# Patient Record
Sex: Male | Born: 1960 | Race: Black or African American | Hispanic: No | Marital: Married | Smoking: Never smoker
Health system: Southern US, Community
[De-identification: ages and names within clinical notes are randomized; demographics above are authoritative.]

## PROBLEM LIST (undated history)

## (undated) DIAGNOSIS — E7800 Pure hypercholesterolemia, unspecified: Secondary | ICD-10-CM

## (undated) DIAGNOSIS — K219 Gastro-esophageal reflux disease without esophagitis: Secondary | ICD-10-CM

## (undated) DIAGNOSIS — D649 Anemia, unspecified: Secondary | ICD-10-CM

## (undated) DIAGNOSIS — I10 Essential (primary) hypertension: Secondary | ICD-10-CM

## (undated) DIAGNOSIS — S76119A Strain of unspecified quadriceps muscle, fascia and tendon, initial encounter: Secondary | ICD-10-CM

## (undated) DIAGNOSIS — R7303 Prediabetes: Secondary | ICD-10-CM

## (undated) HISTORY — DX: Gastro-esophageal reflux disease without esophagitis: K21.9

## (undated) HISTORY — DX: Pure hypercholesterolemia, unspecified: E78.00

## (undated) HISTORY — DX: Strain of unspecified quadriceps muscle, fascia and tendon, initial encounter: S76.119A

## (undated) HISTORY — DX: Anemia, unspecified: D64.9

## (undated) HISTORY — DX: Prediabetes: R73.03

## (undated) HISTORY — DX: Essential (primary) hypertension: I10

---

## 2013-08-08 DIAGNOSIS — I10 Essential (primary) hypertension: Secondary | ICD-10-CM

## 2013-08-08 DIAGNOSIS — E7800 Pure hypercholesterolemia, unspecified: Secondary | ICD-10-CM

## 2013-08-08 HISTORY — DX: Essential (primary) hypertension: I10

## 2013-08-08 HISTORY — DX: Pure hypercholesterolemia, unspecified: E78.00

## 2013-12-19 ENCOUNTER — Ambulatory Visit: Payer: Self-pay | Admitting: Gastroenterology

## 2013-12-23 LAB — PATHOLOGY REPORT

## 2014-04-03 HISTORY — PX: KNEE SURGERY: SHX244

## 2014-04-22 DIAGNOSIS — R7303 Prediabetes: Secondary | ICD-10-CM

## 2014-04-22 HISTORY — DX: Prediabetes: R73.03

## 2015-04-26 DIAGNOSIS — Z8249 Family history of ischemic heart disease and other diseases of the circulatory system: Secondary | ICD-10-CM | POA: Insufficient documentation

## 2016-11-20 DIAGNOSIS — F5221 Male erectile disorder: Secondary | ICD-10-CM | POA: Insufficient documentation

## 2016-11-20 DIAGNOSIS — K219 Gastro-esophageal reflux disease without esophagitis: Secondary | ICD-10-CM | POA: Insufficient documentation

## 2016-11-20 HISTORY — DX: Gastro-esophageal reflux disease without esophagitis: K21.9

## 2017-05-29 DIAGNOSIS — D649 Anemia, unspecified: Secondary | ICD-10-CM

## 2017-05-29 HISTORY — DX: Anemia, unspecified: D64.9

## 2017-06-14 NOTE — Progress Notes (Signed)
06/15/2017 3:01 PM   Timothy Price [id number] [id number]  Referring provider: Cheryll Dessert, FNP [address] [name], [place] [address]  Chief Complaint  Patient presents with  . Elevated PSA    New Patient  . Erectile Dysfunction  . Urinary Frequency    HPI: Patient is a 57 year old diabetic male who was referred by Cheryll Dessert, FNP for erectile dysfunction and elevated PSA.  Erectile dysfunction SHIM 12.  He has been having difficulty with erections for awhile.   His major complaint is lack of firmness.   His libido is preserved.  His risk factors for ED are age, BPH, DM, HTN and HLD.  He denies any painful erections or curvatures with his erections.   He is still having spontaneous erections.  He has tried supplements in the past.   SHIM    Row Name 06/15/17 0858         SHIM: Over the last 6 months:   How do you rate your confidence that you could get and keep an erection?  Low     When you had erections with sexual stimulation, how often were your erections hard enough for penetration (entering your partner)?  Sometimes (about half the time)     During sexual intercourse, how often were you able to maintain your erection after you had penetrated (entered) your partner?  A Few Times (much less than half the time)     During sexual intercourse, how difficult was it to maintain your erection to completion of intercourse?  Difficult     When you attempted sexual intercourse, how often was it satisfactory for you?  A Few Times (much less than half the time)       SHIM Total Score   SHIM  12        Score: 1-7 Severe ED 8-11 Moderate ED 12-16 Mild-Moderate ED 17-21 Mild ED 22-25 No ED  Elevated PSA PSA trend 3.13 in 11/2014 4.27 in 11/2016 3.65 in 05/2017   BPH WITH LUTS  (prostate and/or bladder) IPSS score: 26/4   PVR: 53 mL     Major complaint(s):frequency x 6-7, strong urgency, nocturia x 2 and intermittency  x for awhile.  Denies any dysuria, hematuria or suprapubic pain.   Denies any recent fevers, chills, nausea or vomiting.  He does not have a family history of PCa.  IPSS    Row Name 06/15/17 0800         International Prostate Symptom Score   How often have you had the sensation of not emptying your bladder?  Almost always     How often have you had to urinate less than every two hours?  Almost always     How often have you found you stopped and started again several times when you urinated?  Almost always     How often have you found it difficult to postpone urination?  Almost always     How often have you had a weak urinary stream?  Less than half the time     How often have you had to strain to start urination?  Less than half the time     How many times did you typically get up at night to urinate?  2 Times     Total IPSS Score  26       Quality of Life due to urinary symptoms   If you were to spend the rest of your life with your  urinary condition just the way it is now how would you feel about that?  Mostly Disatisfied        Score:  1-7 Mild 8-19 Moderate 20-35 Severe  PMH: Past Medical History:  Diagnosis Date  . Anemia, unspecified 05/29/2017   Overview:  Monitoring  . Gastroesophageal reflux disease without esophagitis 11/20/2016   Overview:  Controlled OTC prn treatment only.   Marland Kitchen HTN (hypertension) 08/08/2013  . Hypercholesterolemia 08/08/2013  . Prediabetes 04/22/2014  . Rupture of quadriceps tendon 06/15/2017    Surgical History: Knee surgery 2016  Home Medications:  Allergies as of 06/15/2017   No Known Allergies     Medication List        Accurate as of 06/15/17 11:59 PM. Always use your most recent med list.          aspirin EC 81 MG tablet Take by mouth.   atorvastatin 20 MG tablet Commonly known as:  LIPITOR Take by mouth.   finasteride 5 MG tablet Commonly known as:  PROSCAR Take 1 tablet (5 mg total) by mouth daily.   lisinopril-hydrochlorothiazide  20-25 MG tablet Commonly known as:  PRINZIDE,ZESTORETIC Take by mouth.   mirabegron ER 25 MG Tb24 tablet Commonly known as:  MYRBETRIQ Take 1 tablet (25 mg total) by mouth daily.   niacin 500 MG tablet Take by mouth.   sildenafil 100 MG tablet Commonly known as:  VIAGRA Take 1 tablet (100 mg total) by mouth daily as needed for erectile dysfunction.       Allergies: No Known Allergies  Family History: Family History  Problem Relation Age of Onset  . Prostate cancer Neg Hx   . Kidney Stones Neg Hx     Social History:  reports that he has never smoked. He has never used smokeless tobacco. He reports that he drinks alcohol. He reports that he does not use drugs.  ROS: UROLOGY Frequent Urination?: Yes Hard to postpone urination?: Yes Burning/pain with urination?: No Get up at night to urinate?: Yes Leakage of urine?: No Urine stream starts and stops?: Yes Trouble starting stream?: No Do you have to strain to urinate?: No Blood in urine?: No Urinary tract infection?: No Sexually transmitted disease?: No Injury to kidneys or bladder?: No Painful intercourse?: No Weak stream?: No Erection problems?: Yes Penile pain?: No  Gastrointestinal Nausea?: No Vomiting?: No Indigestion/heartburn?: No Diarrhea?: No Constipation?: Yes  Constitutional Fever: No Night sweats?: Yes Weight loss?: No Fatigue?: No  Skin Skin rash/lesions?: No Itching?: No  Eyes Blurred vision?: No Double vision?: No  Ears/Nose/Throat Sore throat?: No Sinus problems?: No  Hematologic/Lymphatic Swollen glands?: No Easy bruising?: No  Cardiovascular Leg swelling?: No Chest pain?: No  Respiratory Cough?: No Shortness of breath?: No  Endocrine Excessive thirst?: No  Musculoskeletal Back pain?: No Joint pain?: Yes  Neurological Headaches?: No Dizziness?: No  Psychologic Depression?: No Anxiety?: No  Physical Exam: BP 139/71   Pulse 72   Ht 6' (1.829 m)   Wt 255  lb (115.7 kg)   BMI 34.58 kg/m   Constitutional: Well nourished. Alert and oriented, No acute distress. HEENT: [place] AT, moist mucus membranes. Trachea midline, no masses. Cardiovascular: No clubbing, cyanosis, or edema. Respiratory: Normal respiratory effort, no increased work of breathing. GI: Abdomen is soft, non tender, non distended, no abdominal masses. Liver and spleen not palpable.  No hernias appreciated.  Stool sample for occult testing is not indicated.   GU: No CVA tenderness.  No bladder fullness or masses.  Patient with circumcised phallus.   Urethral meatus is patent.  No penile discharge. No penile lesions or rashes. Scrotum without lesions, cysts, rashes and/or edema.  Testicles are located scrotally bilaterally. No masses are appreciated in the testicles. Left and right epididymis are normal. Rectal: Patient with  normal sphincter tone. Anus and perineum without scarring or rashes. No rectal masses are appreciated. Prostate is approximately 55 grams, no nodules are appreciated. Seminal vesicles are normal. Skin: No rashes, bruises or suspicious lesions. Lymph: No cervical or inguinal adenopathy. Neurologic: Grossly intact, no focal deficits, moving all 4 extremities. Psychiatric: Normal mood and affect.  Laboratory Data: No results found for: WBC, HGB, HCT, MCV, PLT  No results found for: CREATININE  No results found for: PSA  No results found for: TESTOSTERONE  No results found for: HGBA1C  No results found for: TSH  No results found for: CHOL, HDL, CHOLHDL, VLDL, LDLCALC  No results found for: AST No results found for: ALT No components found for: ALKALINEPHOPHATASE No components found for: BILIRUBINTOTAL  No results found for: ESTRADIOL  Urinalysis No results found for: COLORURINE, APPEARANCEUR, LABSPEC, PHURINE, GLUCOSEU, HGBUR, BILIRUBINUR, KETONESUR, PROTEINUR, UROBILINOGEN, NITRITE, LEUKOCYTESUR  I have reviewed the labs.   Pertinent  Imaging: Results for Timothy LefortDAVIS, Alyan L (MRN [id number]) as of 07/09/2017 15:01  Ref. Range 06/15/2017 08:55  Scan Result Unknown 53ml    Assessment & Plan:    1. History of elevated PSA PSA trend is not concerning at this time Continue with annual monitoring  2. Erectile dysfunction  - SHIM score is 12  - I explained to the patient that in order to achieve an erection it takes good functioning of the nervous system (parasympathetic and rs, sympathetic, sensory and motor), good blood flow into the erectile tissue of the penis and a desire to have sex  - I explained that conditions like diabetes, hypertension, coronary artery disease, peripheral vascular disease, smoking, alcohol consumption, age, sleep apnea and BPH can diminish the ability to have an erection  - A recent study published in Sex Med 2018 Apr 13 revealed moderate to vigorous aerobic exercise for 40 minutes 4 times per week can decrease erectile problems caused by physical inactivity, obesity, hypertension, metabolic syndrome and/or cardiovascular diseases  - We discussed trying a PDE5 inhibitor - patient is agreeable to this  -Prescription sent in for Viagra 100 mg advised to take 2 hours prior to intercourse and empty stomach not to take it with any nitrates  - RTC in 3 weeks for repeat SHIM score and exam   3. BPH with LUTS  - IPSS score is 26/4  - Continue conservative management, avoiding bladder irritants and timed voiding's  - most bothersome symptoms is/are frequency and strong urgency  - Initiate 5 alpha reductase inhibitor (finasteride 5 mg daily), discussed side effects   - RTC in 3 months for IPSS and exam   4. Frequency Offered medical therapy with anticholinergic therapy or beta-3 adrenergic receptor agonist and the potential side effects of each therapy- would like to try the beta-3 adrenergic receptor agonist (Myrbetriq).  Given Myrbetriq 25 mg samples, #28.  I have reviewed with the patient of the side effects of  Myrbetriq, such as: elevation in BP, urinary retention and/or HA.   RTC in 3 weeks for PVR and symptom recheck    Return in about 3 weeks (around 07/06/2017) for IPSS and PVR.  These notes generated with voice recognition software. I apologize for typographical errors.  Tywanda Rice,  PA-C  [place] [address] [employer] [place], [place] [address] [phone]

## 2017-06-15 ENCOUNTER — Encounter: Payer: Self-pay | Admitting: Urology

## 2017-06-15 ENCOUNTER — Ambulatory Visit (INDEPENDENT_AMBULATORY_CARE_PROVIDER_SITE_OTHER): Payer: 59 | Admitting: Urology

## 2017-06-15 VITALS — BP 139/71 | HR 72 | Ht 72.0 in | Wt 255.0 lb

## 2017-06-15 DIAGNOSIS — Z87898 Personal history of other specified conditions: Secondary | ICD-10-CM

## 2017-06-15 DIAGNOSIS — N138 Other obstructive and reflux uropathy: Secondary | ICD-10-CM

## 2017-06-15 DIAGNOSIS — R350 Frequency of micturition: Secondary | ICD-10-CM | POA: Diagnosis not present

## 2017-06-15 DIAGNOSIS — N529 Male erectile dysfunction, unspecified: Secondary | ICD-10-CM

## 2017-06-15 DIAGNOSIS — N401 Enlarged prostate with lower urinary tract symptoms: Secondary | ICD-10-CM

## 2017-06-15 DIAGNOSIS — S76119A Strain of unspecified quadriceps muscle, fascia and tendon, initial encounter: Secondary | ICD-10-CM

## 2017-06-15 HISTORY — DX: Strain of unspecified quadriceps muscle, fascia and tendon, initial encounter: S76.119A

## 2017-06-15 LAB — BLADDER SCAN AMB NON-IMAGING

## 2017-06-15 MED ORDER — MIRABEGRON ER 25 MG PO TB24: 25.0000 mg | ORAL_TABLET | Freq: Every day | ORAL | 12 refills | Status: DC

## 2017-06-15 MED ORDER — FINASTERIDE 5 MG PO TABS: 5.0000 mg | ORAL_TABLET | Freq: Every day | ORAL | 3 refills | Status: DC

## 2017-06-15 MED ORDER — SILDENAFIL CITRATE 100 MG PO TABS: 100.0000 mg | ORAL_TABLET | Freq: Every day | ORAL | 0 refills | Status: DC | PRN

## 2017-06-15 NOTE — Patient Instructions (Addendum)
Finasteride (Proscar) tablets What is this medicine? FINASTERIDE (fi NAS teer ide) is used to treat benign prostatic hyperplasia (BPH) in men. This is a condition that causes you to have an enlarged prostate. This medicine helps to control your symptoms, decrease urinary retention, and reduces your risk of needing surgery. When used in combination with certain other medicines, this drug can slow down the progression of your disease. This medicine may be used for other purposes; ask your health care provider or pharmacist if you have questions. COMMON BRAND NAME(S): Proscar What should I tell my health care provider before I take this medicine? They need to know if you have any of these conditions: -liver disease -an unusual or allergic reaction to finasteride, other medicines, foods, dyes, or preservatives -pregnant or trying to get pregnant -breast-feeding How should I use this medicine? Take this medicine by mouth with a glass of water. Follow the directions on the prescription label. You can take this medicine with or without food. Take your doses at regular intervals. Do not take your medicine more often than directed. Do not stop taking except on the advice of your doctor or health care professional. Talk to your pediatrician regarding the use of this medicine in children. Special care may be needed. Overdosage: If you think you have taken too much of this medicine contact a poison control center or emergency room at once. NOTE: This medicine is only for you. Do not share this medicine with others. What if I miss a dose? If you miss a dose, take it as soon as you can. If it is almost time for your next dose, take only that dose. Do not take double or extra doses. What may interact with this medicine? -saw palmetto or other dietary supplements This list may not describe all possible interactions. Give your health care provider a list of all the medicines, herbs, non-prescription drugs, or  dietary supplements you use. Also tell them if you smoke, drink alcohol, or use illegal drugs. Some items may interact with your medicine. What should I watch for while using this medicine? Do not donate blood while you are taking this medicine. This will prevent giving this medicine to a pregnant male through a blood transfusion. Ask your doctor or health care professional when it is safe to donate blood after you stop taking this medicine. Women who are pregnant or may get pregnant must not handle broken or crushed finasteride tablets. The active ingredient could harm the unborn baby. If a pregnant woman comes into contact with broken or crushed tablets she should check with her doctor or health care professional. Exposure to whole tablets is not expected to cause harm as long as they are not swallowed. Contact your doctor or health care professional if your symptoms do not start to get better. You may need to take this medicine for 6 to 12 months to get the best results. This medicine can interfere with PSA laboratory tests for prostate cancer. If you are scheduled to have a lab test for prostate cancer, tell your doctor or health care professional that you are taking this medicine. This medicine may increase your risk of getting some cancers, like breast cancer. Talk with your doctor. What side effects may I notice from receiving this medicine? Side effects that you should report to your doctor or health care professional as soon as possible: -any signs of an allergic reaction like rash, itching, hives or swelling of the lips or face -changes in breast like   lumps, pain or fluids leaking from the nipple -pain in the testicles Side effects that usually do not require medical attention (report to your doctor or health care professional if they continue or are bothersome): -sexual difficulties like decreased sexual desire or ability to get an erection -small amount of semen released during sex This  list may not describe all possible side effects. Call your doctor for medical advice about side effects. You may report side effects to FDA at 1-800-FDA-1088. Where should I keep my medicine? Keep out of the reach of children. Store at room temperature below 30 degrees C (86 degrees F). Protect from light. Keep container tightly closed. Throw away any unused medicine after the expiration date. NOTE: This sheet is a summary. It may not cover all possible information. If you have questions about this medicine, talk to your doctor, pharmacist, or health care provider.  2018 Elsevier/Gold Standard (2014-11-05 17:24:30) Mirabegron extended-release tablets What is this medicine? MIRABEGRON (MIR a BEG ron) is used to treat overactive bladder. This medicine reduces the amount of bathroom visits. It may also help to control wetting accidents. This medicine may be used for other purposes; ask your health care provider or pharmacist if you have questions. COMMON BRAND NAME(S): Myrbetriq What should I tell my health care provider before I take this medicine? They need to know if you have any of these conditions: -difficulty passing urine -high blood pressure -kidney disease -liver disease -an unusual or allergic reaction to mirabegron, other medicines, foods, dyes, or preservatives -pregnant or trying to get pregnant -breast-feeding How should I use this medicine? Take this medicine by mouth with a glass of water. Follow the directions on the prescription label. Do not cut, crush or chew this medicine. You can take it with or without food. If it upsets your stomach, take it with food. Take your medicine at regular intervals. Do not take it more often than directed. Do not stop taking except on your doctor's advice. Talk to your pediatrician regarding the use of this medicine in children. Special care may be needed. Overdosage: If you think you have taken too much of this medicine contact a poison control  center or emergency room at once. NOTE: This medicine is only for you. Do not share this medicine with others. What if I miss a dose? If you miss a dose, take it as soon as you can. If it is almost time for your next dose, take only that dose. Do not take double or extra doses. What may interact with this medicine? -certain medicines for bladder problems like fesoterodine, oxybutynin, solifenacin, tolterodine -desipramine -digoxin -flecainide -ketoconazole -MAOIs like Carbex, Eldepryl, Marplan, Nardil, and Parnate -metoprolol -propafenone -thioridazine -warfarin This list may not describe all possible interactions. Give your health care provider a list of all the medicines, herbs, non-prescription drugs, or dietary supplements you use. Also tell them if you smoke, drink alcohol, or use illegal drugs. Some items may interact with your medicine. What should I watch for while using this medicine? It may take 8 weeks to notice the full benefit from this medicine. You may need to limit your intake tea, coffee, caffeinated sodas, and alcohol. These drinks may make your symptoms worse. Visit your doctor or health care professional for regular checks on your progress. Check your blood pressure as directed. Ask your doctor or health care professional what your blood pressure should be and when you should contact him or her. What side effects may I notice from receiving this  medicine? Side effects that you should report to your doctor or health care professional as soon as possible: -allergic reactions like skin rash, itching or hives, swelling of the face, lips, or tongue -chest pain or palpitations -severe or sudden headache -high blood pressure -fast, irregular heartbeat -redness, blistering, peeling or loosening of the skin, including inside the mouth -signs of infection like fever or chills; cough; sore throat; pain or difficulty passing urine -trouble passing urine or change in the amount of  urine Side effects that usually do not require medical attention (report to your doctor or health care professional if they continue or are bothersome): -constipation -diarrhea -dizziness -dry eyes -joint pain -mild headache -nausea -runny nose This list may not describe all possible side effects. Call your doctor for medical advice about side effects. You may report side effects to FDA at 1-800-FDA-1088. Where should I keep my medicine? Keep out of the reach of children. Store at room temperature between 15 and 30 degrees C (59 and 86 degrees F). Throw away any unused medicine after the expiration date. NOTE: This sheet is a summary. It may not cover all possible information. If you have questions about this medicine, talk to your doctor, pharmacist, or health care provider.  2018 Elsevier/Gold Standard (2015-04-22 12:14:30) Sildenafil tablets (Viagra) What is this medicine? SILDENAFIL (sil DEN a fil) is used to treat erection problems in men. This medicine may be used for other purposes; ask your health care provider or pharmacist if you have questions. COMMON BRAND NAME(S): Viagra What should I tell my health care provider before I take this medicine? They need to know if you have any of these conditions: -bleeding disorders -eye or vision problems, including a rare inherited eye disease called retinitis pigmentosa -anatomical deformation of the penis, Peyronie's disease, or history of priapism (painful and prolonged erection) -heart disease, angina, a history of heart attack, irregular heart beats, or other heart problems -high or low blood pressure -history of blood diseases, like sickle cell anemia or leukemia -history of stomach bleeding -kidney disease -liver disease -stroke -an unusual or allergic reaction to sildenafil, other medicines, foods, dyes, or preservatives -pregnant or trying to get pregnant -breast-feeding How should I use this medicine? Take this medicine  by mouth with a glass of water. Follow the directions on the prescription label. The dose is usually taken 1 hour before sexual activity. You should not take the dose more than once per day. Do not take your medicine more often than directed. Talk to your pediatrician regarding the use of this medicine in children. This medicine is not used in children for this condition. Overdosage: If you think you have taken too much of this medicine contact a poison control center or emergency room at once. NOTE: This medicine is only for you. Do not share this medicine with others. What if I miss a dose? This does not apply. Do not take double or extra doses. What may interact with this medicine? Do not take this medicine with any of the following medications: -cisapride -nitrates like amyl nitrite, isosorbide dinitrate, isosorbide mononitrate, nitroglycerin -riociguat This medicine may also interact with the following medications: -antiviral medicines for HIV or AIDS -bosentan -certain medicines for benign prostatic hyperplasia (BPH) -certain medicines for blood pressure -certain medicines for fungal infections like ketoconazole and itraconazole -cimetidine -erythromycin -rifampin This list may not describe all possible interactions. Give your health care provider a list of all the medicines, herbs, non-prescription drugs, or dietary supplements you use. Also  tell them if you smoke, drink alcohol, or use illegal drugs. Some items may interact with your medicine. What should I watch for while using this medicine? If you notice any changes in your vision while taking this drug, call your doctor or health care professional as soon as possible. Stop using this medicine and call your health care provider right away if you have a loss of sight in one or both eyes. Contact your doctor or health care professional right away if you have an erection that lasts longer than 4 hours or if it becomes painful. This may  be a sign of a serious problem and must be treated right away to prevent permanent damage. If you experience symptoms of nausea, dizziness, chest pain or arm pain upon initiation of sexual activity after taking this medicine, you should refrain from further activity and call your doctor or health care professional as soon as possible. Do not drink alcohol to excess (examples, 5 glasses of wine or 5 shots of whiskey) when taking this medicine. When taken in excess, alcohol can increase your chances of getting a headache or getting dizzy, increasing your heart rate or lowering your blood pressure. Using this medicine does not protect you or your partner against HIV infection (the virus that causes AIDS) or other sexually transmitted diseases. What side effects may I notice from receiving this medicine? Side effects that you should report to your doctor or health care professional as soon as possible: -allergic reactions like skin rash, itching or hives, swelling of the face, lips, or tongue -breathing problems -changes in hearing -changes in vision -chest pain -fast, irregular heartbeat -prolonged or painful erection -seizures Side effects that usually do not require medical attention (report to your doctor or health care professional if they continue or are bothersome): -back pain -dizziness -flushing -headache -indigestion -muscle aches -nausea -stuffy or runny nose This list may not describe all possible side effects. Call your doctor for medical advice about side effects. You may report side effects to FDA at 1-800-FDA-1088. Where should I keep my medicine? Keep out of reach of children. Store at room temperature between 15 and 30 degrees C (59 and 86 degrees F). Throw away any unused medicine after the expiration date. NOTE: This sheet is a summary. It may not cover all possible information. If you have questions about this medicine, talk to your doctor, pharmacist, or health care  provider.  2018 Elsevier/Gold Standard (2015-03-03 12:00:25)

## 2017-07-12 NOTE — Progress Notes (Deleted)
07/13/2017 8:34 AM   Timothy Price 03/19/1961 [id number]  Referring provider: Cheryll Dessert, FNP [name] [place], [place] [address]  No chief complaint on file.   HPI: Patient is a 57 year old African-American male with a history of elevated PSA, BPH with L UTS, ED and frequency who presents today for a three week follow up after trial of Myrbetriq 25 mg daily.  Erectile dysfunction SHIM 12.  He has been having difficulty with erections for awhile.   His major complaint is lack of firmness.   His libido is preserved.  His risk factors for ED are age, BPH, DM, HTN and HLD.  He denies any painful erections or curvatures with his erections.   He is still having spontaneous erections.  He has tried supplements in the past.   SHIM    Row Name 06/15/17 0858         SHIM: Over the last 6 months:   How do you rate your confidence that you could get and keep an erection?  Low     When you had erections with sexual stimulation, how often were your erections hard enough for penetration (entering your partner)?  Sometimes (about half the time)     During sexual intercourse, how often were you able to maintain your erection after you had penetrated (entered) your partner?  A Few Times (much less than half the time)     During sexual intercourse, how difficult was it to maintain your erection to completion of intercourse?  Difficult     When you attempted sexual intercourse, how often was it satisfactory for you?  A Few Times (much less than half the time)       SHIM Total Score   SHIM  12        Score: 1-7 Severe ED 8-11 Moderate ED 12-16 Mild-Moderate ED 17-21 Mild ED 22-25 No ED  Elevated PSA PSA trend 3.13 in 11/2014 4.27 in 11/2016 3.65 in 05/2017   BPH WITH LUTS  (prostate and/or bladder) IPSS score: ***  PVR: *** mL    Previous I PSS score: 26/4    PVR: 53 mL  Major complaint(s):frequency x 6-7, strong urgency, nocturia x 2 and  intermittency  x for awhile. Denies any dysuria, hematuria or suprapubic pain.   Denies any recent fevers, chills, nausea or vomiting.  He does not have a family history of PCa.  IPSS    Row Name 06/15/17 0800         International Prostate Symptom Score   How often have you had the sensation of not emptying your bladder?  Almost always     How often have you had to urinate less than every two hours?  Almost always     How often have you found you stopped and started again several times when you urinated?  Almost always     How often have you found it difficult to postpone urination?  Almost always     How often have you had a weak urinary stream?  Less than half the time     How often have you had to strain to start urination?  Less than half the time     How many times did you typically get up at night to urinate?  2 Times     Total IPSS Score  26       Quality of Life due to urinary symptoms   If you were to spend  the rest of your life with your urinary condition just the way it is now how would you feel about that?  Mostly Disatisfied        Score:  1-7 Mild 8-19 Moderate 20-35 Severe  Frequency Started on Myrbetriq 25 mg 3 weeks ago.   His PVR is ***.   His BP is ***.      PMH: Past Medical History:  Diagnosis Date  . Anemia, unspecified 05/29/2017   Overview:  Monitoring  . Gastroesophageal reflux disease without esophagitis 11/20/2016   Overview:  Controlled OTC prn treatment only.   Marland Kitchen HTN (hypertension) 08/08/2013  . Hypercholesterolemia 08/08/2013  . Prediabetes 04/22/2014  . Rupture of quadriceps tendon 06/15/2017    Surgical History: Knee surgery 2016  Home Medications:  Allergies as of 07/13/2017   No Known Allergies     Medication List        Accurate as of 07/12/17  8:34 AM. Always use your most recent med list.          aspirin EC 81 MG tablet Take by mouth.   atorvastatin 20 MG tablet Commonly known as:  LIPITOR Take by mouth.   finasteride  5 MG tablet Commonly known as:  PROSCAR Take 1 tablet (5 mg total) by mouth daily.   lisinopril-hydrochlorothiazide 20-25 MG tablet Commonly known as:  PRINZIDE,ZESTORETIC Take by mouth.   mirabegron ER 25 MG Tb24 tablet Commonly known as:  MYRBETRIQ Take 1 tablet (25 mg total) by mouth daily.   niacin 500 MG tablet Take by mouth.   sildenafil 100 MG tablet Commonly known as:  VIAGRA Take 1 tablet (100 mg total) by mouth daily as needed for erectile dysfunction.       Allergies: No Known Allergies  Family History: Family History  Problem Relation Age of Onset  . Prostate cancer Neg Hx   . Kidney Stones Neg Hx     Social History:  reports that he has never smoked. He has never used smokeless tobacco. He reports that he drinks alcohol. He reports that he does not use drugs.  ROS:                                        Physical Exam: There were no vitals taken for this visit.  Constitutional: Well nourished. Alert and oriented, No acute distress. HEENT: [place] AT, moist mucus membranes. Trachea midline, no masses. Cardiovascular: No clubbing, cyanosis, or edema. Respiratory: Normal respiratory effort, no increased work of breathing. Skin: No rashes, bruises or suspicious lesions. Lymph: No cervical or inguinal adenopathy. Neurologic: Grossly intact, no focal deficits, moving all 4 extremities. Psychiatric: Normal mood and affect.  Laboratory Data: No results found for: WBC, HGB, HCT, MCV, PLT  No results found for: CREATININE  No results found for: PSA  No results found for: TESTOSTERONE  No results found for: HGBA1C  No results found for: TSH  No results found for: CHOL, HDL, CHOLHDL, VLDL, LDLCALC  No results found for: AST No results found for: ALT No components found for: ALKALINEPHOPHATASE No components found for: BILIRUBINTOTAL  No results found for: ESTRADIOL  Urinalysis No results found for: COLORURINE, APPEARANCEUR,  LABSPEC, PHURINE, GLUCOSEU, HGBUR, BILIRUBINUR, KETONESUR, PROTEINUR, UROBILINOGEN, NITRITE, LEUKOCYTESUR  I have reviewed the labs.   Pertinent Imaging: ***   Assessment & Plan:    1. History of elevated PSA PSA trend is not concerning at  this time Continue with annual monitoring  2. Erectile dysfunction  - SHIM score is 12  - I explained to the patient that in order to achieve an erection it takes good functioning of the nervous system (parasympathetic and rs, sympathetic, sensory and motor), good blood flow into the erectile tissue of the penis and a desire to have sex  - I explained that conditions like diabetes, hypertension, coronary artery disease, peripheral vascular disease, smoking, alcohol consumption, age, sleep apnea and BPH can diminish the ability to have an erection  - A recent study published in Sex Med 2018 Apr 13 revealed moderate to vigorous aerobic exercise for 40 minutes 4 times per week can decrease erectile problems caused by physical inactivity, obesity, hypertension, metabolic syndrome and/or cardiovascular diseases  - We discussed trying a PDE5 inhibitor - patient is agreeable to this  -Prescription sent in for Viagra 100 mg advised to take 2 hours prior to intercourse and empty stomach not to take it with any nitrates  - RTC in 3 weeks for repeat SHIM score and exam   3. BPH with LUTS  - IPSS score is 26/4  - Continue conservative management, avoiding bladder irritants and timed voiding's  - most bothersome symptoms is/are frequency and strong urgency  - Initiate 5 alpha reductase inhibitor (finasteride 5 mg daily), discussed side effects   - RTC in 3 months for IPSS and exam   4. Frequency Offered medical therapy with anticholinergic therapy or beta-3 adrenergic receptor agonist and the potential side effects of each therapy- would like to try the beta-3 adrenergic receptor agonist (Myrbetriq).  Given Myrbetriq 25 mg samples, #28.  I have reviewed with the  patient of the side effects of Myrbetriq, such as: elevation in BP, urinary retention and/or HA.   RTC in 3 weeks for PVR and symptom recheck    No follow-ups on file.  These notes generated with voice recognition software. I apologize for typographical errors.  Michiel CowboySHANNON Jodine Muchmore, PA-C  [facility] [address], [address] [place], [place] [address] [phone]

## 2017-07-13 ENCOUNTER — Ambulatory Visit: Payer: 59 | Admitting: Urology

## 2017-07-23 ENCOUNTER — Telehealth: Payer: Self-pay | Admitting: Urology

## 2017-07-23 NOTE — Telephone Encounter (Signed)
He could come to the office and pick up Toviaz 4 mg daily samples to see if they are effective.

## 2017-07-23 NOTE — Telephone Encounter (Signed)
Which medication is he referring to, the Viagra, Myrbetriq or the finasteride?

## 2017-07-23 NOTE — Telephone Encounter (Signed)
I rescheduled his app to end of May the medication you gave him cost $400.00 so he has not been able to afford it. Is there anything else he can take?  Or does he need to take anything at all?  Please advise  Marcelino DusterMichelle

## 2017-07-23 NOTE — Telephone Encounter (Signed)
The one that helps with urinating? He didn't really say the name.  Timothy Price

## 2017-07-23 NOTE — Telephone Encounter (Signed)
Can you pull these samples for the patient please? I will call him and let him know they will be up front.   Thanks, Timothy Price Timothy Price

## 2017-07-24 NOTE — Telephone Encounter (Signed)
Toviaz 4mg , left up front (1 month worth)   Lot: [id number] Exp: 01/20

## 2017-07-27 ENCOUNTER — Ambulatory Visit: Payer: 59 | Admitting: Urology

## 2017-08-30 NOTE — Progress Notes (Signed)
08/31/2017 9:20 AM   Timothy Price June 21, 1960 [id number]  Referring provider: Cheryll Dessert, FNP [name] [place], [place] [address]  Chief Complaint  Patient presents with  . Elevated PSA    3wk    HPI: Patient is a 57 year old diabetic male with ED, history of elevated PSA, BPH with LU TS and frequency who presents today for a follow up.    Erectile dysfunction SHIM score is 15.   His previous SHIM score was 12.  He has been having difficulty with erections for awhile.   His major complaint is lack of firmness.   His libido is preserved.  His risk factors for ED are age, BPH, DM, HTN and HLD.  He denies any painful erections or curvatures with his erections.   He is still having spontaneous erections.  He has tried supplements in the past.  He found the Viagra satisfactory.  SHIM    Row Name 08/31/17 0902         SHIM: Over the last 6 months:   How do you rate your confidence that you could get and keep an erection?  Low     When you had erections with sexual stimulation, how often were your erections hard enough for penetration (entering your partner)?  Sometimes (about half the time)     During sexual intercourse, how often were you able to maintain your erection after you had penetrated (entered) your partner?  Sometimes (about half the time)     During sexual intercourse, how difficult was it to maintain your erection to completion of intercourse?  Slightly Difficult     When you attempted sexual intercourse, how often was it satisfactory for you?  Sometimes (about half the time)       SHIM Total Score   SHIM  15        Score: 1-7 Severe ED 8-11 Moderate ED 12-16 Mild-Moderate ED 17-21 Mild ED 22-25 No ED  Elevated PSA PSA trend 3.13 in 11/2014 4.27 in 11/2016 3.65 in 05/2017   BPH WITH LUTS  (prostate and/or bladder) I PSS score: 6/2   Previous IPSS score: 26/4   Previous PVR: 53 mL     He was initiated on finasteride 5  mg daily.   He has been taking this medication daily.    Major complaint(s):frequency has improved, mild urgency, nocturia x 1 and no more intermittency since taking the Toviaz 4 mg.  Denies any dysuria, hematuria or suprapubic pain.   Denies any recent fevers, chills, nausea or vomiting.  He does not have a family history of PCa.  IPSS    Row Name 08/31/17 0900         International Prostate Symptom Score   How often have you had the sensation of not emptying your bladder?  Less than 1 in 5     How often have you had to urinate less than every two hours?  Less than half the time     How often have you found you stopped and started again several times when you urinated?  Less than 1 in 5 times     How often have you found it difficult to postpone urination?  Less than 1 in 5 times     How often have you had a weak urinary stream?  Not at All     How often have you had to strain to start urination?  Not at All     How  many times did you typically get up at night to urinate?  1 Time     Total IPSS Score  6       Quality of Life due to urinary symptoms   If you were to spend the rest of your life with your urinary condition just the way it is now how would you feel about that?  Mostly Satisfied        Score:  1-7 Mild 8-19 Moderate 20-35 Severe  Frequency Given Myrbetriq 25 mg daily samples at his last visit.  He found that the medication was going to be cost prohibitive.  Samples of Toviaz 4 mg daily were given.  He felt the Gala Murdoch was effective.  He states he was able to travel in the car for longer distances.    PMH: Past Medical History:  Diagnosis Date  . Anemia, unspecified 05/29/2017   Overview:  Monitoring  . Gastroesophageal reflux disease without esophagitis 11/20/2016   Overview:  Controlled OTC prn treatment only.   Marland Kitchen HTN (hypertension) 08/08/2013  . Hypercholesterolemia 08/08/2013  . Prediabetes 04/22/2014  . Rupture of quadriceps tendon 06/15/2017    Surgical  History: Knee surgery 2016  Home Medications:  Allergies as of 08/31/2017   No Known Allergies     Medication List        Accurate as of 08/31/17  9:20 AM. Always use your most recent med list.          aspirin EC 81 MG tablet Take by mouth.   atorvastatin 20 MG tablet Commonly known as:  LIPITOR Take by mouth.   finasteride 5 MG tablet Commonly known as:  PROSCAR Take 1 tablet (5 mg total) by mouth daily.   lisinopril-hydrochlorothiazide 20-25 MG tablet Commonly known as:  PRINZIDE,ZESTORETIC Take by mouth.   oxybutynin 10 MG 24 hr tablet Commonly known as:  DITROPAN-XL Take 1 tablet (10 mg total) by mouth at bedtime.       Allergies: No Known Allergies  Family History: Family History  Problem Relation Age of Onset  . Prostate cancer Neg Hx   . Kidney Stones Neg Hx     Social History:  reports that he has never smoked. He has never used smokeless tobacco. He reports that he drinks alcohol. He reports that he does not use drugs.  ROS: UROLOGY Frequent Urination?: Yes Hard to postpone urination?: No Burning/pain with urination?: No Get up at night to urinate?: No Leakage of urine?: No Urine stream starts and stops?: No Trouble starting stream?: No Do you have to strain to urinate?: No Blood in urine?: No Urinary tract infection?: No Sexually transmitted disease?: No Injury to kidneys or bladder?: No Painful intercourse?: No Weak stream?: No Erection problems?: Yes Penile pain?: No  Gastrointestinal Nausea?: No Vomiting?: No Indigestion/heartburn?: No Diarrhea?: No Constipation?: No  Constitutional Fever: No Night sweats?: No Weight loss?: No Fatigue?: No  Skin Skin rash/lesions?: No Itching?: No  Eyes Blurred vision?: No Double vision?: No  Ears/Nose/Throat Sore throat?: No Sinus problems?: No  Hematologic/Lymphatic Swollen glands?: No Easy bruising?: No  Cardiovascular Leg swelling?: No Chest pain?:  No  Respiratory Cough?: No Shortness of breath?: No  Endocrine Excessive thirst?: No  Musculoskeletal Back pain?: No Joint pain?: No  Neurological Headaches?: No Dizziness?: No  Psychologic Depression?: No Anxiety?: No  Physical Exam: BP 134/71   Pulse 78   Ht 6' (1.829 m)   Wt 255 lb (115.7 kg)   BMI 34.58 kg/m   Constitutional: Well  nourished. Alert and oriented, No acute distress. HEENT: [place] AT, moist mucus membranes. Trachea midline, no masses. Cardiovascular: No clubbing, cyanosis, or edema. Respiratory: Normal respiratory effort, no increased work of breathing. Skin: No rashes, bruises or suspicious lesions. Lymph: No cervical or inguinal adenopathy. Neurologic: Grossly intact, no focal deficits, moving all 4 extremities. Psychiatric: Normal mood and affect.  Laboratory Data: No results found for: WBC, HGB, HCT, MCV, PLT  No results found for: CREATININE  No results found for: PSA  No results found for: TESTOSTERONE  No results found for: HGBA1C  No results found for: TSH  No results found for: CHOL, HDL, CHOLHDL, VLDL, LDLCALC  No results found for: AST No results found for: ALT No components found for: ALKALINEPHOPHATASE No components found for: BILIRUBINTOTAL  No results found for: ESTRADIOL  Urinalysis No results found for: COLORURINE, APPEARANCEUR, LABSPEC, PHURINE, GLUCOSEU, HGBUR, BILIRUBINUR, KETONESUR, PROTEINUR, UROBILINOGEN, NITRITE, LEUKOCYTESUR  I have reviewed the labs.   Assessment & Plan:    1. History of elevated PSA PSA trend is not concerning at this time PSA is drawn today to check response to the finasteride - if PSA is stable  2. Erectile dysfunction SHIM score is 15, it is improving He found the Viagra satisfactory RTC in one year for repeat SHIM score and exam   3. BPH with LUTS IPSS score is 6/2, it is improving Continue conservative management, avoiding bladder irritants and timed voiding's Continue the  finasteride 5 mg daily RTC in 12 months for I PSS, PSA and exam - if PSA is stable  4. Frequency At goal with OAB medication, sent prescription in for oxybutynin XL 10 mg daily sent to pharmacy RTC in 12 months for I PSS and PVR   Return in about 1 year (around 09/01/2018) for IPSS, SHIM, PSA and exam.  These notes generated with voice recognition software. I apologize for typographical errors.  Michiel Cowboy, PA-C  [facility] [address] [place], [place] [address] [phone]

## 2017-08-31 ENCOUNTER — Ambulatory Visit (INDEPENDENT_AMBULATORY_CARE_PROVIDER_SITE_OTHER): Payer: 59 | Admitting: Urology

## 2017-08-31 ENCOUNTER — Encounter: Payer: Self-pay | Admitting: Urology

## 2017-08-31 ENCOUNTER — Other Ambulatory Visit
Admission: RE | Admit: 2017-08-31 | Discharge: 2017-08-31 | Disposition: A | Discharge status: Home / Self Care | Payer: 59 | Source: Ambulatory Visit | Attending: Urology | Admitting: Urology

## 2017-08-31 VITALS — BP 134/71 | HR 78 | Ht 72.0 in | Wt 255.0 lb

## 2017-08-31 DIAGNOSIS — Z87898 Personal history of other specified conditions: Secondary | ICD-10-CM

## 2017-08-31 DIAGNOSIS — R350 Frequency of micturition: Secondary | ICD-10-CM | POA: Diagnosis not present

## 2017-08-31 DIAGNOSIS — N401 Enlarged prostate with lower urinary tract symptoms: Secondary | ICD-10-CM

## 2017-08-31 DIAGNOSIS — N138 Other obstructive and reflux uropathy: Secondary | ICD-10-CM | POA: Diagnosis not present

## 2017-08-31 DIAGNOSIS — N529 Male erectile dysfunction, unspecified: Secondary | ICD-10-CM

## 2017-08-31 LAB — PSA: PROSTATIC SPECIFIC ANTIGEN: 2.93 ng/mL (ref 0.00–4.00)

## 2017-08-31 MED ORDER — OXYBUTYNIN CHLORIDE ER 10 MG PO TB24
10.0000 mg | ORAL_TABLET | Freq: Every day | ORAL | 3 refills | Status: DC
Start: 2017-08-31 — End: 2018-10-21

## 2017-08-31 NOTE — Addendum Note (Signed)
Addended by: Martha Clan on: 08/31/2017 10:13 AM   Modules accepted: Orders

## 2017-09-03 ENCOUNTER — Telehealth: Payer: Self-pay | Admitting: Urology

## 2017-09-03 ENCOUNTER — Telehealth: Payer: Self-pay

## 2017-09-03 DIAGNOSIS — Z87898 Personal history of other specified conditions: Secondary | ICD-10-CM

## 2017-09-03 NOTE — Telephone Encounter (Signed)
-----   Message from Harle BattiestShannon A McGowan, PA-C sent at 09/02/2017  1:03 PM EDT ----- Please let Mr. Earlene PlaterDavis know that his PSA has reduced, but I would like to ensure the downward trend continues.  It is not at the 50% reduced value one would expect while on the finasteride.  I would like the PSA rechecked in 3 months.

## 2017-09-03 NOTE — Telephone Encounter (Signed)
Patient does not need an appt to have labs drawn in [place]. Called and left message for him to call back. Just need to make sure that the orders are in for him to have them drawn in [place].  Marcelino DusterMichelle

## 2017-09-03 NOTE — Telephone Encounter (Signed)
Pt informed, please call and schedule 3 month lab.

## 2018-01-01 ENCOUNTER — Other Ambulatory Visit
Admission: RE | Admit: 2018-01-01 | Discharge: 2018-01-01 | Disposition: A | Discharge status: Home / Self Care | Payer: 59 | Source: Ambulatory Visit | Attending: Urology | Admitting: Urology

## 2018-01-01 DIAGNOSIS — Z87898 Personal history of other specified conditions: Secondary | ICD-10-CM | POA: Diagnosis not present

## 2018-01-02 ENCOUNTER — Telehealth: Payer: Self-pay

## 2018-01-02 LAB — PSA: Prostatic Specific Antigen: 1.57 ng/mL (ref 0.00–4.00)

## 2018-01-02 NOTE — Telephone Encounter (Signed)
-----   Message from [name], PA-C sent at 01/02/2018 10:28 AM EDT ----- Please let Mr. Blyden know that his PSA has come down further.  We do need to see him sometime this month as we need to see folks every 6 months for an exam as well when on finasteride.

## 2018-01-02 NOTE — Telephone Encounter (Signed)
Left pt mess to call 

## 2018-01-10 ENCOUNTER — Encounter: Payer: Self-pay | Admitting: Urology

## 2018-01-10 ENCOUNTER — Ambulatory Visit: Payer: 59 | Admitting: Urology

## 2018-01-10 VITALS — BP 126/83 | HR 83 | Ht 72.0 in | Wt 255.0 lb

## 2018-01-10 DIAGNOSIS — N529 Male erectile dysfunction, unspecified: Secondary | ICD-10-CM | POA: Diagnosis not present

## 2018-01-10 DIAGNOSIS — Z87898 Personal history of other specified conditions: Secondary | ICD-10-CM

## 2018-01-10 DIAGNOSIS — N138 Other obstructive and reflux uropathy: Secondary | ICD-10-CM | POA: Diagnosis not present

## 2018-01-10 DIAGNOSIS — R3989 Other symptoms and signs involving the genitourinary system: Secondary | ICD-10-CM

## 2018-01-10 DIAGNOSIS — N401 Enlarged prostate with lower urinary tract symptoms: Secondary | ICD-10-CM | POA: Diagnosis not present

## 2018-01-10 DIAGNOSIS — R350 Frequency of micturition: Secondary | ICD-10-CM

## 2018-01-10 LAB — BLADDER SCAN AMB NON-IMAGING: Scan Result: 80

## 2018-01-10 MED ORDER — SILDENAFIL CITRATE 20 MG PO TABS: ORAL_TABLET | ORAL | 3 refills | Status: DC

## 2018-01-10 MED ORDER — FINASTERIDE 5 MG PO TABS: 5.0000 mg | ORAL_TABLET | Freq: Every day | ORAL | 3 refills | Status: DC

## 2018-01-10 NOTE — Progress Notes (Signed)
01/10/2018 9:22 AM   Timothy Price September 04, 1960 [id number]  Referring provider: Cheryll Dessert, FNP No address on file  Chief Complaint  Patient presents with  . Follow-up    HPI: Patient is a 57 year old African American diabetic male with ED, history of elevated PSA, BPH with LU TS and frequency who presents today for a follow up.    Erectile dysfunction SHIM score is 14, with is mild to moderate ED.   His previous SHIM score was 15.  He has been having difficulty with erections for awhile.   His major complaint is lack of firmness.   His libido is preserved.  His risk factors for ED are age, BPH, DM, HTN and HLD.  He denies any painful erections or curvatures with his erections.   He is still having spontaneous erections.  He found the Viagra satisfactory.  SHIM    Row Name 01/10/18 0903         SHIM: Over the last 6 months:   How do you rate your confidence that you could get and keep an erection?  Low     When you had erections with sexual stimulation, how often were your erections hard enough for penetration (entering your partner)?  Sometimes (about half the time)     During sexual intercourse, how often were you able to maintain your erection after you had penetrated (entered) your partner?  Sometimes (about half the time)     During sexual intercourse, how difficult was it to maintain your erection to completion of intercourse?  Difficult     When you attempted sexual intercourse, how often was it satisfactory for you?  Sometimes (about half the time)       SHIM Total Score   SHIM  14        Score: 1-7 Severe ED 8-11 Moderate ED 12-16 Mild-Moderate ED 17-21 Mild ED 22-25 No ED  Elevated PSA PSA trend 3.13 in 11/2014 4.27 in 11/2016 3.65 in 05/2017 Started finasteride in 06/2017 2.93 in 08/2017 1.57 (3.14) in 01/2018  BPH WITH LUTS  (prostate and/or bladder) I PSS score: 12/3   PVR: 80 mL    Previous IPSS score: 6/2   Previous PVR: 53 mL     He is  taking finasteride 5 mg daily.  He could not tolerate the dry mouth of oxybutynin XL 10 mg daily.    Major complaint(s): Frequency and urgency x  6 months.   Denies any dysuria, hematuria or suprapubic pain.   Denies any recent fevers, chills, nausea or vomiting.  He does not have a family history of PCa.  IPSS    Row Name 01/10/18 0900         International Prostate Symptom Score   How often have you had the sensation of not emptying your bladder?  Less than half the time     How often have you had to urinate less than every two hours?  About half the time     How often have you found you stopped and started again several times when you urinated?  Less than half the time     How often have you found it difficult to postpone urination?  Less than half the time     How often have you had a weak urinary stream?  Less than 1 in 5 times     How often have you had to strain to start urination?  Less than 1 in 5 times  How many times did you typically get up at night to urinate?  1 Time     Total IPSS Score  12       Quality of Life due to urinary symptoms   If you were to spend the rest of your life with your urinary condition just the way it is now how would you feel about that?  Mixed        Score:  1-7 Mild 8-19 Moderate 20-35 Severe  Frequency Currently on oxybutynin XL 10 mg daily  PMH: Past Medical History:  Diagnosis Date  . Anemia, unspecified 05/29/2017   Overview:  Monitoring  . Gastroesophageal reflux disease without esophagitis 11/20/2016   Overview:  Controlled OTC prn treatment only.   Marland Kitchen HTN (hypertension) 08/08/2013  . Hypercholesterolemia 08/08/2013  . Prediabetes 04/22/2014  . Rupture of quadriceps tendon 06/15/2017    Surgical History: Knee surgery 2016  Home Medications:  Allergies as of 01/10/2018   No Known Allergies     Medication List        Accurate as of 01/10/18  9:22 AM. Always use your most recent med list.          aspirin EC 81 MG  tablet Take by mouth.   atorvastatin 20 MG tablet Commonly known as:  LIPITOR Take by mouth.   finasteride 5 MG tablet Commonly known as:  PROSCAR Take 1 tablet (5 mg total) by mouth daily.   lisinopril-hydrochlorothiazide 20-25 MG tablet Commonly known as:  PRINZIDE,ZESTORETIC Take by mouth.   oxybutynin 10 MG 24 hr tablet Commonly known as:  DITROPAN-XL Take 1 tablet (10 mg total) by mouth at bedtime.   sildenafil 20 MG tablet Commonly known as:  REVATIO Take 3 to 5 tablets two hours before intercouse on an empty stomach.  Do not take with nitrates.       Allergies: No Known Allergies  Family History: Family History  Problem Relation Age of Onset  . Prostate cancer Neg Hx   . Kidney Stones Neg Hx     Social History:  reports that he has never smoked. He has never used smokeless tobacco. He reports that he drinks alcohol. He reports that he does not use drugs.  ROS: UROLOGY Frequent Urination?: No Hard to postpone urination?: No Burning/pain with urination?: No Get up at night to urinate?: No Leakage of urine?: No Urine stream starts and stops?: No Trouble starting stream?: No Do you have to strain to urinate?: No Blood in urine?: No Urinary tract infection?: No Sexually transmitted disease?: No Injury to kidneys or bladder?: No Painful intercourse?: No Weak stream?: No Erection problems?: No Penile pain?: No  Gastrointestinal Nausea?: No Indigestion/heartburn?: No Diarrhea?: No Constipation?: No  Constitutional Fever: No Night sweats?: No Weight loss?: No Fatigue?: No  Skin Skin rash/lesions?: No Itching?: No  Eyes Blurred vision?: No Double vision?: No  Ears/Nose/Throat Sore throat?: No Sinus problems?: No  Hematologic/Lymphatic Swollen glands?: No Easy bruising?: No  Cardiovascular Leg swelling?: No Chest pain?: No  Respiratory Cough?: No Shortness of breath?: No  Endocrine Excessive thirst?: No  Musculoskeletal Back  pain?: No Joint pain?: No  Neurological Headaches?: No Dizziness?: No  Psychologic Depression?: No Anxiety?: No  Physical Exam: BP 126/83 (BP Location: Left Arm, Patient Position: Sitting, Cuff Size: Normal)   Pulse 83   Ht 6' (1.829 m)   Wt 255 lb (115.7 kg)   BMI 34.58 kg/m   Constitutional: Well nourished. Alert and oriented, No acute distress. HEENT: [place]  AT, moist mucus membranes. Trachea midline, no masses. Cardiovascular: No clubbing, cyanosis, or edema. Respiratory: Normal respiratory effort, no increased work of breathing. GI: Abdomen is soft, non tender, non distended, no abdominal masses. Liver and spleen not palpable.  No hernias appreciated.  Stool sample for occult testing is not indicated.   GU: No CVA tenderness.  No bladder fullness or masses.  Patient with circumcised phallus.  Urethral meatus is patent.  No penile discharge. No penile lesions or rashes. Scrotum without lesions, cysts, rashes and/or edema.  Testicles are located scrotally bilaterally. No masses are appreciated in the testicles. Left and right epididymis are normal. Rectal: Patient with  normal sphincter tone. Anus and perineum without scarring or rashes. No rectal masses are appreciated. Prostate is approximately 55 grams, could only palpate the apex and midportion of gland, firm area in the right apex noted, no nodules are appreciated.  Skin: No rashes, bruises or suspicious lesions. Lymph: No cervical or inguinal adenopathy. Neurologic: Grossly intact, no focal deficits, moving all 4 extremities. Psychiatric: Normal mood and affect.  Laboratory Data: No results found for: WBC, HGB, HCT, MCV, PLT  No results found for: CREATININE  No results found for: PSA  No results found for: TESTOSTERONE  No results found for: HGBA1C  No results found for: TSH  No results found for: CHOL, HDL, CHOLHDL, VLDL, LDLCALC  No results found for: AST No results found for: ALT No components found for:  ALKALINEPHOPHATASE No components found for: BILIRUBINTOTAL  No results found for: ESTRADIOL  Urinalysis No results found for: COLORURINE, APPEARANCEUR, LABSPEC, PHURINE, GLUCOSEU, HGBUR, BILIRUBINUR, KETONESUR, PROTEINUR, UROBILINOGEN, NITRITE, LEUKOCYTESUR  I have reviewed the labs.  Pertinent Imaging Results for MIRAJ, TRUSS (MRN [id number]) as of 01/10/2018 14:06  Ref. Range 01/10/2018 14:06  Scan Result Unknown 80 mL     Assessment & Plan:    1. History of elevated PSA Current PSA is 1.57 (3.14)  Continue finasteride 5 mg daily RTC pending MRI results  2. Abnormal prostate exam Explained to the patient that this is an uncertain finding, this may be just a normal variant due to the finasteride or a more concerning finding due to prostate cancer Our options at this point are to either undergo a prostate biopsy, obtain an MRI of the prostate gland to evaluate for lesions may represent a high-grade prostate cancer or return in 3 months for repeat PSA and exam keeping in mind that if there is prostate cancer present this would delay diagnosis He would like to have an MRI of his prostate at this time   2. Erectile dysfunction SHIM score is 14, it is slightly worse Refill of Viagra given RTC in 6 months for repeat SHIM score and exam   3. BPH with LUTS IPSS score is 12/3, it is worsening Continue conservative management, avoiding bladder irritants and timed voiding's Continue the finasteride 5 mg daily Discussed undergoing a cystoscopy for further evaluation of his urinary symptoms - he will defer at this time RTC in 6 months for I PSS, PSA and exam  - pending MRI results  4. Frequency We will manage conservatively at this time  Return in about 6 months (around 07/12/2018) for IPSS, SHIM, PSA and exam.  These notes generated with voice recognition software. I apologize for typographical errors.  Michiel Cowboy, PA-C  [facility] [address] [place], [place] [address] [phone]

## 2018-02-22 ENCOUNTER — Telehealth: Payer: Self-pay | Admitting: Urology

## 2018-02-22 NOTE — Telephone Encounter (Signed)
Would you check on the status of Mr. Earlene PlaterDavis' MRI of his prostate?

## 2018-02-22 NOTE — Telephone Encounter (Signed)
I had to cancel his 1st PA and get it re approved because he said he never got a call from [facility]. It has been approved and I transferred him over to the scheduling dept to schedule.  Thanks, Marcelino DusterMichelle

## 2018-03-13 ENCOUNTER — Ambulatory Visit
Admission: RE | Admit: 2018-03-13 | Discharge: 2018-03-13 | Disposition: A | Discharge status: Home / Self Care | Payer: 59 | Source: Ambulatory Visit | Attending: Urology | Admitting: Urology

## 2018-03-13 DIAGNOSIS — Z87898 Personal history of other specified conditions: Secondary | ICD-10-CM

## 2018-03-13 DIAGNOSIS — R3989 Other symptoms and signs involving the genitourinary system: Secondary | ICD-10-CM

## 2018-03-13 MED ORDER — GADOBENATE DIMEGLUMINE 529 MG/ML IV SOLN
20.0000 mL | Freq: Once | INTRAVENOUS | Status: AC | PRN
  Administered 2018-03-13: 20 mL via INTRAVENOUS

## 2018-04-15 ENCOUNTER — Other Ambulatory Visit: Payer: Self-pay | Admitting: Urology

## 2018-06-27 ENCOUNTER — Other Ambulatory Visit: Payer: Self-pay | Admitting: Radiology

## 2018-06-27 DIAGNOSIS — Z87898 Personal history of other specified conditions: Secondary | ICD-10-CM

## 2018-07-04 ENCOUNTER — Telehealth: Payer: Self-pay | Admitting: Urology

## 2018-07-04 ENCOUNTER — Other Ambulatory Visit
Admission: RE | Admit: 2018-07-04 | Discharge: 2018-07-04 | Disposition: A | Discharge status: Home / Self Care | Payer: 59 | Attending: Urology | Admitting: Urology

## 2018-07-04 ENCOUNTER — Other Ambulatory Visit: Payer: Self-pay

## 2018-07-04 DIAGNOSIS — Z87898 Personal history of other specified conditions: Secondary | ICD-10-CM | POA: Insufficient documentation

## 2018-07-04 LAB — PSA: Prostatic Specific Antigen: 1.93 ng/mL (ref 0.00–4.00)

## 2018-07-04 NOTE — Telephone Encounter (Signed)
Pt went to have PSA drawn today in [place], but wanted to move his office visit out a few weeks to Monday 6/8 in [place].  Just F.Y.I.

## 2018-07-05 ENCOUNTER — Telehealth: Payer: Self-pay

## 2018-07-05 NOTE — Telephone Encounter (Signed)
Called pt, no answer. LM for pt informing him of the information below. Advised pt to call back for questions or concerns.  

## 2018-07-05 NOTE — Telephone Encounter (Signed)
-----   Message from [name], PA-C sent at 07/05/2018  9:07 AM EDT ----- Please let Mr. Swayzer know that his PSA was 1.93.  We will see him in June.

## 2018-07-08 ENCOUNTER — Telehealth: Payer: Self-pay | Admitting: Urology

## 2018-07-08 ENCOUNTER — Ambulatory Visit: Payer: 59 | Admitting: Urology

## 2018-07-12 ENCOUNTER — Ambulatory Visit: Payer: 59 | Admitting: Urology

## 2018-07-25 NOTE — Telephone Encounter (Signed)
error 

## 2018-08-29 ENCOUNTER — Ambulatory Visit: Payer: 59 | Admitting: Urology

## 2018-09-03 DIAGNOSIS — L739 Follicular disorder, unspecified: Secondary | ICD-10-CM | POA: Insufficient documentation

## 2018-09-06 DIAGNOSIS — E559 Vitamin D deficiency, unspecified: Secondary | ICD-10-CM | POA: Insufficient documentation

## 2018-09-09 ENCOUNTER — Ambulatory Visit: Payer: 59 | Admitting: Urology

## 2018-09-26 ENCOUNTER — Other Ambulatory Visit: Payer: Self-pay | Admitting: Urology

## 2018-10-16 ENCOUNTER — Other Ambulatory Visit: Payer: Self-pay

## 2018-10-16 DIAGNOSIS — Z87898 Personal history of other specified conditions: Secondary | ICD-10-CM

## 2018-10-16 DIAGNOSIS — N138 Other obstructive and reflux uropathy: Secondary | ICD-10-CM

## 2018-10-17 ENCOUNTER — Other Ambulatory Visit: Payer: Self-pay

## 2018-10-17 ENCOUNTER — Other Ambulatory Visit
Admission: RE | Admit: 2018-10-17 | Discharge: 2018-10-17 | Disposition: A | Discharge status: Home / Self Care | Payer: 59 | Attending: Urology | Admitting: Urology

## 2018-10-17 DIAGNOSIS — Z87898 Personal history of other specified conditions: Secondary | ICD-10-CM | POA: Insufficient documentation

## 2018-10-17 DIAGNOSIS — N401 Enlarged prostate with lower urinary tract symptoms: Secondary | ICD-10-CM | POA: Insufficient documentation

## 2018-10-17 DIAGNOSIS — N138 Other obstructive and reflux uropathy: Secondary | ICD-10-CM | POA: Insufficient documentation

## 2018-10-17 LAB — PSA: Prostatic Specific Antigen: 1.11 ng/mL (ref 0.00–4.00)

## 2018-10-20 NOTE — Progress Notes (Signed)
10/21/2018 3:46 PM   Timothy Price 16-Jul-1960 [id number]  Referring provider: Cheryll DessertGeyer, Katherine, FNP No address on file  Chief Complaint  Patient presents with  . Follow-up    6 month f/u PSA     HPI: Patient is a 58 year old diabetic male with ED, history of elevated PSA, BPH with LU TS and frequency who presents today for a follow up.    Erectile dysfunction SHIM score is 17, with is mild ED.   His previous SHIM score was 14.  He has been having difficulty with erections for awhile.   His major complaint is lack of firmness.   His libido is preserved.  His risk factors for ED are age, BPH, DM, HTN and HLD.  He denies any painful erections or curvatures with his erections.   He is still having spontaneous erections.  He found the Viagra satisfactory.  SHIM    Row Name 10/21/18 1534         SHIM: Over the last 6 months:   How do you rate your confidence that you could get and keep an erection?  Moderate     When you had erections with sexual stimulation, how often were your erections hard enough for penetration (entering your partner)?  Sometimes (about half the time)     During sexual intercourse, how often were you able to maintain your erection after you had penetrated (entered) your partner?  Most Times (much more than half the time)     During sexual intercourse, how difficult was it to maintain your erection to completion of intercourse?  Difficult     When you attempted sexual intercourse, how often was it satisfactory for you?  Most Times (much more than half the time)       SHIM Total Score   SHIM  17        Score: 1-7 Severe ED 8-11 Moderate ED 12-16 Mild-Moderate ED 17-21 Mild ED 22-25 No ED  Elevated PSA PSA trend 3.13 in 11/2014 4.27 in 11/2016 3.65 in 05/2017 Started finasteride in 06/2017 2.93 in 08/2017 1.57 (3.14) in 01/2018 1.93 (3.86) in 07/2018 1.11 (2.22) in 10/2018 Prostate MRI in 03/2018 was negative  BPH WITH LUTS  (prostate and/or  bladder) I PSS score: 12/2    Previous IPSS score: 12/3   Previous PVR: 80 mL     He is taking finasteride 5 mg daily.  He could not tolerate the dry mouth of oxybutynin XL 10 mg daily.  He is not longer experiencing the urinary frequency.    Major complaint(s): He has no urinary complaints at this time.  Denies any dysuria, hematuria or suprapubic pain.   Denies any recent fevers, chills, nausea or vomiting.  He does not have a family history of PCa.  IPSS    Row Name 10/21/18 1500         International Prostate Symptom Score   How often have you had the sensation of not emptying your bladder?  Less than half the time     How often have you had to urinate less than every two hours?  Less than half the time     How often have you found you stopped and started again several times when you urinated?  About half the time     How often have you found it difficult to postpone urination?  Less than half the time     How often have you had a weak urinary stream?  Less  than 1 in 5 times     How often have you had to strain to start urination?  Less than 1 in 5 times     How many times did you typically get up at night to urinate?  1 Time     Total IPSS Score  12       Quality of Life due to urinary symptoms   If you were to spend the rest of your life with your urinary condition just the way it is now how would you feel about that?  Mostly Satisfied        Score:  1-7 Mild 8-19 Moderate 20-35 Severe   PMH: Past Medical History:  Diagnosis Date  . Anemia, unspecified 05/29/2017   Overview:  Monitoring  . Gastroesophageal reflux disease without esophagitis 11/20/2016   Overview:  Controlled OTC prn treatment only.   Marland Kitchen. HTN (hypertension) 08/08/2013  . Hypercholesterolemia 08/08/2013  . Prediabetes 04/22/2014  . Rupture of quadriceps tendon 06/15/2017    Surgical History: Knee surgery 2016  Home Medications:  Allergies as of 10/21/2018   No Known Allergies     Medication List        Accurate as of October 21, 2018  3:46 PM. If you have any questions, ask your nurse or doctor.        STOP taking these medications   oxybutynin 10 MG 24 hr tablet Commonly known as: DITROPAN-XL Stopped by: Michiel CowboySHANNON Tanya Crothers, PA-C     TAKE these medications   aspirin EC 81 MG tablet Take by mouth.   atorvastatin 20 MG tablet Commonly known as: LIPITOR Take by mouth.   finasteride 5 MG tablet Commonly known as: Proscar Take 1 tablet (5 mg total) by mouth daily.   lisinopril-hydrochlorothiazide 20-25 MG tablet Commonly known as: ZESTORETIC Take by mouth.   sildenafil 100 MG tablet Commonly known as: VIAGRA TAKE 1 TABLET BY MOUTH ONCE DAILY AS NEEDED FOR ERECTILE DYSFUNCTION   sildenafil 20 MG tablet Commonly known as: REVATIO Take 3 to 5 tablets two hours before intercouse on an empty stomach.  Do not take with nitrates.       Allergies: No Known Allergies  Family History: Family History  Problem Relation Age of Onset  . Prostate cancer Neg Hx   . Kidney Stones Neg Hx     Social History:  reports that he has never smoked. He has never used smokeless tobacco. He reports current alcohol use. He reports that he does not use drugs.  ROS: UROLOGY Frequent Urination?: No Hard to postpone urination?: No Burning/pain with urination?: No Get up at night to urinate?: No Leakage of urine?: No Urine stream starts and stops?: No Trouble starting stream?: No Do you have to strain to urinate?: No Blood in urine?: No Urinary tract infection?: No Sexually transmitted disease?: No Injury to kidneys or bladder?: No Painful intercourse?: No Weak stream?: No Erection problems?: No Penile pain?: No  Gastrointestinal Nausea?: No Vomiting?: No Indigestion/heartburn?: No Diarrhea?: No Constipation?: No  Constitutional Fever: No Night sweats?: No Weight loss?: No Fatigue?: No  Skin Skin rash/lesions?: No Itching?: No  Eyes Blurred vision?: No Double vision?:  No  Ears/Nose/Throat Sore throat?: No Sinus problems?: No  Hematologic/Lymphatic Swollen glands?: No Easy bruising?: No  Cardiovascular Leg swelling?: No Chest pain?: No  Respiratory Cough?: No Shortness of breath?: No  Endocrine Excessive thirst?: No  Musculoskeletal Back pain?: No Joint pain?: No  Neurological Headaches?: No Dizziness?: No  Psychologic Depression?: No Anxiety?:  No  Physical Exam: BP (!) 141/93   Pulse 75   Temp 98 F (36.7 C) (Oral)   Resp 16   Ht 6' (1.829 m)   Wt 266 lb (120.7 kg)   SpO2 99%   BMI 36.08 kg/m   Constitutional:  Well nourished. Alert and oriented, No acute distress. HEENT: [place] AT, moist mucus membranes.  Trachea midline, no masses. Cardiovascular: No clubbing, cyanosis, or edema. Respiratory: Normal respiratory effort, no increased work of breathing. Neurologic: Grossly intact, no focal deficits, moving all 4 extremities. Psychiatric: Normal mood and affect.  Patient deferred the rest of the exam.   Laboratory Data: No results found for: WBC, HGB, HCT, MCV, PLT  No results found for: CREATININE  No results found for: PSA  No results found for: TESTOSTERONE  No results found for: HGBA1C  No results found for: TSH  No results found for: CHOL, HDL, CHOLHDL, VLDL, LDLCALC  No results found for: AST No results found for: ALT No components found for: ALKALINEPHOPHATASE No components found for: BILIRUBINTOTAL  No results found for: ESTRADIOL  Urinalysis No results found for: COLORURINE, APPEARANCEUR, LABSPEC, PHURINE, GLUCOSEU, HGBUR, BILIRUBINUR, KETONESUR, PROTEINUR, UROBILINOGEN, NITRITE, LEUKOCYTESUR  I have reviewed the labs.  Pertinent Imaging CLINICAL DATA:  PSA greater than 3.  Biopsy 2015.  EXAM: MR PROSTATE WITHOUT AND WITH CONTRAST  TECHNIQUE: Multiplanar multisequence MRI images were obtained of the pelvis centered about the prostate. Pre and post contrast images were obtained.   CONTRAST:  65mL MULTIHANCE GADOBENATE DIMEGLUMINE 529 MG/ML IV SOLN  COMPARISON:  None.  FINDINGS: Prostate: No foci of restricted diffusion within the peripheral zone. No discrete lesion within the peripheral zone on T2 weighted imaging (series 8).  The transitional zone is nodular with well capsulated nodules. Prostatic capsule is intact. Seminal vesicles are normal.  Volume: 5.8 by 3.6 by 5.6 cm (volume = 61 cm^3)  Transcapsular spread:  Absent  Seminal vesicle involvement: Absent  Neurovascular bundle involvement: Absent  Pelvic adenopathy: Absent  Bone metastasis: Absent  Other findings: None  IMPRESSION: *No high-grade carcinoma in the peripheral zone. *Nodular transitional zone consistent benign prostate hypertrophy.   Electronically Signed   By: Suzy Bouchard M.D.   On: 03/13/2018 15:59    Assessment & Plan:    1. History of elevated PSA Current PSA is 1.11 (2.22)  Continue finasteride 5 mg daily MRI in 03/2018 negative for high grade carcinoma, consistent with BPH RTC in 6 months for PSA  2. Abnormal prostate exam MRI negative in 03/2018 Patient deferred exam today RTC in 6 months for exam  3. Erectile dysfunction SHIM score is 17, it is improved RTC in 6 months for repeat SHIM score and exam   4. BPH with LUTS IPSS score is 12/2, it is improved  Continue conservative management, avoiding bladder irritants and timed voiding's Continue the finasteride 5 mg daily RTC in 6 months for I PSS, PSA and exam    Return in about 6 months (around 04/23/2019) for IPSS, SHIM, PSA and exam.  These notes generated with voice recognition software. I apologize for typographical errors.  Zara Council, PA-C  [facility] [address] [place], [place] [address] [phone]

## 2018-10-21 ENCOUNTER — Other Ambulatory Visit: Payer: Self-pay

## 2018-10-21 ENCOUNTER — Ambulatory Visit (INDEPENDENT_AMBULATORY_CARE_PROVIDER_SITE_OTHER): Payer: 59 | Admitting: Urology

## 2018-10-21 ENCOUNTER — Encounter: Payer: Self-pay | Admitting: Urology

## 2018-10-21 VITALS — BP 141/93 | HR 75 | Temp 98.0°F | Resp 16 | Ht 72.0 in | Wt 266.0 lb

## 2018-10-21 DIAGNOSIS — Z87898 Personal history of other specified conditions: Secondary | ICD-10-CM

## 2018-10-21 DIAGNOSIS — N529 Male erectile dysfunction, unspecified: Secondary | ICD-10-CM

## 2018-10-21 DIAGNOSIS — N401 Enlarged prostate with lower urinary tract symptoms: Secondary | ICD-10-CM | POA: Diagnosis not present

## 2018-10-21 DIAGNOSIS — N138 Other obstructive and reflux uropathy: Secondary | ICD-10-CM

## 2018-10-21 MED ORDER — FINASTERIDE 5 MG PO TABS: 5.0000 mg | ORAL_TABLET | Freq: Every day | ORAL | 3 refills | Status: DC

## 2018-11-28 ENCOUNTER — Other Ambulatory Visit: Payer: Self-pay | Admitting: Urology

## 2019-04-21 ENCOUNTER — Other Ambulatory Visit: Payer: Self-pay | Admitting: *Deleted

## 2019-04-21 DIAGNOSIS — Z87898 Personal history of other specified conditions: Secondary | ICD-10-CM

## 2019-04-28 ENCOUNTER — Other Ambulatory Visit
Admission: RE | Admit: 2019-04-28 | Discharge: 2019-04-28 | Disposition: A | Discharge status: Home / Self Care | Payer: 59 | Attending: Urology | Admitting: Urology

## 2019-04-28 ENCOUNTER — Encounter: Payer: Self-pay | Admitting: Urology

## 2019-04-28 ENCOUNTER — Ambulatory Visit: Payer: 59 | Admitting: Urology

## 2019-04-28 ENCOUNTER — Other Ambulatory Visit: Payer: Self-pay

## 2019-04-28 VITALS — BP 127/78 | HR 70 | Ht 72.0 in | Wt 257.0 lb

## 2019-04-28 DIAGNOSIS — Z87898 Personal history of other specified conditions: Secondary | ICD-10-CM

## 2019-04-28 DIAGNOSIS — N138 Other obstructive and reflux uropathy: Secondary | ICD-10-CM

## 2019-04-28 DIAGNOSIS — N401 Enlarged prostate with lower urinary tract symptoms: Secondary | ICD-10-CM | POA: Diagnosis not present

## 2019-04-28 DIAGNOSIS — N529 Male erectile dysfunction, unspecified: Secondary | ICD-10-CM

## 2019-04-28 LAB — PSA: Prostatic Specific Antigen: 1.21 ng/mL (ref 0.00–4.00)

## 2019-04-28 NOTE — Progress Notes (Signed)
04/28/2019 3:14 PM   Timothy Price [birth year] [id number]  Referring provider: [facility] [facility] [place],  [place] [address]  Chief Complaint  Patient presents with  . Elevated PSA    16mo follow up    HPI: Patient is a 59 year old diabetic male with ED, history of elevated PSA, BPH with LU TS and frequency who presents today for a follow up.    Erectile dysfunction SHIM score is 17, with is mild ED.   His previous SHIM score was 17.  He has been having difficulty with erections for awhile.   His major complaint is lack of firmness.   His libido is preserved.  His risk factors for ED are age, BPH, DM, HTN and HLD.  He denies any painful erections or curvatures with his erections.   He is still having spontaneous erections.  He found the Viagra satisfactory.   SHIM    Row Name 04/28/19 1509         SHIM: Over the last 6 months:   How do you rate your confidence that you could get and keep an erection?  Moderate     When you had erections with sexual stimulation, how often were your erections hard enough for penetration (entering your partner)?  Most Times (much more than half the time)     During sexual intercourse, how often were you able to maintain your erection after you had penetrated (entered) your partner?  Sometimes (about half the time)     During sexual intercourse, how difficult was it to maintain your erection to completion of intercourse?  Slightly Difficult     When you attempted sexual intercourse, how often was it satisfactory for you?  Sometimes (about half the time)       SHIM Total Score   SHIM  17        Score: 1-7 Severe ED 8-11 Moderate ED 12-16 Mild-Moderate ED 17-21 Mild ED 22-25 No ED  Elevated PSA PSA trend 3.13 in 11/2014 4.27 in 11/2016 3.65 in 05/2017 Started finasteride in 06/2017 2.93 in 08/2017 1.57 (3.14) in 01/2018 1.93 (3.86) in 07/2018 1.11 (2.22) in 10/2018 Prostate MRI in 03/2018 was negative  BPH  WITH LUTS  (prostate and/or bladder) I PSS score: 6/1   Previous IPSS score: 12/2   Previous PVR: 80 mL     He is taking finasteride 5 mg daily.  He could not tolerate the dry mouth of oxybutynin XL 10 mg daily.  He is not longer experiencing the urinary frequency.    Major complaint(s): He has no urinary complaints at this time.  Denies any dysuria, hematuria or suprapubic pain.   Denies any recent fevers, chills, nausea or vomiting.  He does not have a family history of PCa.  IPSS    Row Name 04/28/19 1500         International Prostate Symptom Score   How often have you had the sensation of not emptying your bladder?  Less than 1 in 5     How often have you had to urinate less than every two hours?  Less than half the time     How often have you found you stopped and started again several times when you urinated?  Less than 1 in 5 times     How often have you found it difficult to postpone urination?  Less than 1 in 5 times     How often have you had a weak  urinary stream?  Not at All     How often have you had to strain to start urination?  Not at All     How many times did you typically get up at night to urinate?  1 Time     Total IPSS Score  6       Quality of Life due to urinary symptoms   If you were to spend the rest of your life with your urinary condition just the way it is now how would you feel about that?  Pleased        Score:  1-7 Mild 8-19 Moderate 20-35 Severe   PMH: Past Medical History:  Diagnosis Date  . Anemia, unspecified 05/29/2017   Overview:  Monitoring  . Gastroesophageal reflux disease without esophagitis 11/20/2016   Overview:  Controlled OTC prn treatment only.   Marland Kitchen HTN (hypertension) 08/08/2013  . Hypercholesterolemia 08/08/2013  . Prediabetes 04/22/2014  . Rupture of quadriceps tendon 06/15/2017    Surgical History: Knee surgery 2016  Home Medications:  Allergies as of 04/28/2019   No Known Allergies     Medication List        Accurate as of April 28, 2019  3:14 PM. If you have any questions, ask your nurse or doctor.        aspirin EC 81 MG tablet Take by mouth.   atorvastatin 20 MG tablet Commonly known as: LIPITOR Take by mouth.   finasteride 5 MG tablet Commonly known as: Proscar Take 1 tablet (5 mg total) by mouth daily.   lisinopril-hydrochlorothiazide 20-25 MG tablet Commonly known as: ZESTORETIC Take by mouth.   sildenafil 100 MG tablet Commonly known as: VIAGRA TAKE 1 TABLET BY MOUTH ONCE DAILY AS NEEDED FOR ERECTILE DYSFUNCTION   sildenafil 20 MG tablet Commonly known as: REVATIO Take 3 to 5 tablets two hours before intercouse on an empty stomach.  Do not take with nitrates.       Allergies: No Known Allergies  Family History: Family History  Problem Relation Age of Onset  . Prostate cancer Neg Hx   . Kidney Stones Neg Hx     Social History:  reports that he has never smoked. He has never used smokeless tobacco. He reports current alcohol use. He reports that he does not use drugs.  ROS: UROLOGY Frequent Urination?: No Hard to postpone urination?: No Burning/pain with urination?: No Get up at night to urinate?: No Leakage of urine?: No Urine stream starts and stops?: No Trouble starting stream?: No Do you have to strain to urinate?: No Blood in urine?: No Urinary tract infection?: No Sexually transmitted disease?: No Injury to kidneys or bladder?: No Painful intercourse?: No Weak stream?: No Erection problems?: No Penile pain?: No  Gastrointestinal Nausea?: No Vomiting?: No Indigestion/heartburn?: No Diarrhea?: No Constipation?: No  Constitutional Fever: No Night sweats?: No Weight loss?: No Fatigue?: No  Skin Skin rash/lesions?: No Itching?: No  Eyes Blurred vision?: No Double vision?: No  Ears/Nose/Throat Sore throat?: No Sinus problems?: No  Hematologic/Lymphatic Swollen glands?: No Easy bruising?: No  Cardiovascular Leg swelling?:  No Chest pain?: No  Respiratory Cough?: No Shortness of breath?: No  Endocrine Excessive thirst?: No  Musculoskeletal Back pain?: No Joint pain?: No  Neurological Headaches?: No Dizziness?: No  Psychologic Depression?: No Anxiety?: No  Physical Exam: BP 127/78   Pulse 70   Ht 6' (1.829 m)   Wt 257 lb (116.6 kg)   BMI 34.86 kg/m   Constitutional:  Well  nourished. Alert and oriented, No acute distress. HEENT: [place] AT, mask in place.  Trachea midline, no masses. Cardiovascular: No clubbing, cyanosis, or edema. Respiratory: Normal respiratory effort, no increased work of breathing. GI: Abdomen is soft, non tender, non distended, no abdominal masses. Liver and spleen not palpable.  No hernias appreciated.  Stool sample for occult testing is not indicated.   GU: No CVA tenderness.  No bladder fullness or masses.  Patient with circumcised phallus.   Urethral meatus is patent.  No penile discharge. No penile lesions or rashes. Scrotum without lesions, cysts, rashes and/or edema.  Testicles are located scrotally bilaterally. No masses are appreciated in the testicles. Left and right epididymis are normal. Rectal: Patient with  normal sphincter tone. Anus and perineum without scarring or rashes. No rectal masses are appreciated. Prostate is approximately 60 grams, no nodules are appreciated. Seminal vesicles could not be palpated.   Skin: No rashes, bruises or suspicious lesions. Lymph: No inguinal adenopathy. Neurologic: Grossly intact, no focal deficits, moving all 4 extremities. Psychiatric: Normal mood and affect.  Laboratory Data: No results found for: WBC, HGB, HCT, MCV, PLT  No results found for: CREATININE  No results found for: PSA  No results found for: TESTOSTERONE  No results found for: HGBA1C  No results found for: TSH  No results found for: CHOL, HDL, CHOLHDL, VLDL, LDLCALC  No results found for: AST No results found for: ALT No components found for:  ALKALINEPHOPHATASE No components found for: BILIRUBINTOTAL  No results found for: ESTRADIOL  Urinalysis No results found for: COLORURINE, APPEARANCEUR, LABSPEC, PHURINE, GLUCOSEU, HGBUR, BILIRUBINUR, KETONESUR, PROTEINUR, UROBILINOGEN, NITRITE, LEUKOCYTESUR  I have reviewed the labs.  Pertinent Imaging CLINICAL DATA:  PSA greater than 3.  Biopsy 2015.  EXAM: MR PROSTATE WITHOUT AND WITH CONTRAST  TECHNIQUE: Multiplanar multisequence MRI images were obtained of the pelvis centered about the prostate. Pre and post contrast images were obtained.  CONTRAST:  36mL MULTIHANCE GADOBENATE DIMEGLUMINE 529 MG/ML IV SOLN  COMPARISON:  None.  FINDINGS: Prostate: No foci of restricted diffusion within the peripheral zone. No discrete lesion within the peripheral zone on T2 weighted imaging (series 8).  The transitional zone is nodular with well capsulated nodules. Prostatic capsule is intact. Seminal vesicles are normal.  Volume: 5.8 by 3.6 by 5.6 cm (volume = 61 cm^3)  Transcapsular spread:  Absent  Seminal vesicle involvement: Absent  Neurovascular bundle involvement: Absent  Pelvic adenopathy: Absent  Bone metastasis: Absent  Other findings: None  IMPRESSION: *No high-grade carcinoma in the peripheral zone. *Nodular transitional zone consistent benign prostate hypertrophy.   Electronically Signed   By: Genevive Bi M.D.   On: 03/13/2018 15:59    Assessment & Plan:    1. History of elevated PSA Current PSA is 1.11 (2.22)  Continue finasteride 5 mg daily MRI in 03/2018 negative for high grade carcinoma, consistent with BPH RTC in 6 months for PSA  2. Erectile dysfunction SHIM score is 17, it is stable Continue the sildenafil  RTC in 6 months for repeat SHIM score and exam   3. BPH with LUTS IPSS score is 6/1, it is improved  Continue conservative management, avoiding bladder irritants and timed voiding's Continue the finasteride 5 mg  daily RTC in 6 months for I PSS, PSA and exam - if PSA is stable    Return in about 6 months (around 10/26/2019) for IPSS, SHIM, PSA and exam.  These notes generated with voice recognition software. I apologize for typographical errors.  Zara Council, PA-C  [facility] [address] [place], [place] [address] [phone]

## 2019-04-29 ENCOUNTER — Telehealth: Payer: Self-pay | Admitting: Family Medicine

## 2019-04-29 NOTE — Telephone Encounter (Signed)
LMOM for patient to return call. He needs to have PSA done in 6 months. PSA was added to lab schedule on 10/24/19 at 2:30. He has a follow up scheduled on 10/27/19 at 2:30 shannon will go over results at that time.

## 2019-04-29 NOTE — Telephone Encounter (Signed)
-----   Message from [name], PA-C sent at 04/29/2019  7:36 AM EST ----- Please let Timothy Price know that his PSA has increased a little, 1.21 from 1.11, so I would like to see him in 6 months to keep a close eye on his PSA.

## 2019-04-30 NOTE — Telephone Encounter (Signed)
Pt called back this am and I read him both notes, he voiced understanding.

## 2019-06-11 ENCOUNTER — Other Ambulatory Visit: Payer: Self-pay | Admitting: Urology

## 2019-09-15 ENCOUNTER — Other Ambulatory Visit: Payer: Self-pay | Admitting: Urology

## 2019-10-23 ENCOUNTER — Telehealth: Payer: Self-pay | Admitting: Urology

## 2019-10-23 NOTE — Telephone Encounter (Signed)
Pt called the office confused as to why he had a lab appointment in [facility] Friday 7/23. Pulled up chart, explained that he had a 63mos follow up next week in [place] with PSA prior. Still not clear why lab appt in [place]. Pt stated he's not sure why, he should only have 1 yr follow up. Pt also stated he just had psa drawn about 3 weeks ago at a [facility] location and his psa showed no changes.  Pt asked to cancel upcoming appt and r/s for 1 yr in January 2022  FYI

## 2019-10-23 NOTE — Telephone Encounter (Signed)
He has a history of elevated PSA and he is on finasteride and I have been following him every 6 months.  If his PSA starts to trend up, we need to catch it early rather than let a year go by and then catch it.     The July appointments were for the 6 month follow up.  If he wants his PCP to check it once a year and [place] check it once a year and stagger the appointments, so the PSA is checked every six months that is fine.

## 2019-10-23 NOTE — Telephone Encounter (Signed)
Called pt and explained the importance of getting PSA checked every 6 mos due to hx of elevated PSA . Pt voiced understanding of staggering appts between [facility] and his PCP to have his PSA labs to fall every 6 mos. Pt voiced understanding and plans to comply.

## 2019-10-24 ENCOUNTER — Other Ambulatory Visit: Payer: Self-pay

## 2019-10-27 ENCOUNTER — Ambulatory Visit: Payer: 59 | Admitting: Urology

## 2019-12-22 ENCOUNTER — Other Ambulatory Visit: Payer: Self-pay | Admitting: Urology

## 2020-02-16 ENCOUNTER — Other Ambulatory Visit: Payer: Self-pay | Admitting: Urology

## 2020-03-12 ENCOUNTER — Other Ambulatory Visit: Payer: Self-pay | Admitting: Orthopedic Surgery

## 2020-03-12 DIAGNOSIS — M25512 Pain in left shoulder: Secondary | ICD-10-CM

## 2020-03-12 DIAGNOSIS — M24412 Recurrent dislocation, left shoulder: Secondary | ICD-10-CM

## 2020-03-12 DIAGNOSIS — M25312 Other instability, left shoulder: Secondary | ICD-10-CM

## 2020-03-22 ENCOUNTER — Other Ambulatory Visit: Payer: Self-pay | Admitting: Urology

## 2020-03-25 ENCOUNTER — Other Ambulatory Visit: Payer: Self-pay

## 2020-03-25 ENCOUNTER — Ambulatory Visit (HOSPITAL_COMMUNITY)
Admission: RE | Admit: 2020-03-25 | Discharge: 2020-03-25 | Disposition: A | Discharge status: Home / Self Care | Payer: 59 | Source: Ambulatory Visit | Attending: Orthopedic Surgery | Admitting: Orthopedic Surgery

## 2020-03-25 DIAGNOSIS — M25512 Pain in left shoulder: Secondary | ICD-10-CM | POA: Diagnosis not present

## 2020-03-25 DIAGNOSIS — M25312 Other instability, left shoulder: Secondary | ICD-10-CM | POA: Diagnosis present

## 2020-03-25 DIAGNOSIS — M24412 Recurrent dislocation, left shoulder: Secondary | ICD-10-CM | POA: Diagnosis present

## 2020-04-14 ENCOUNTER — Other Ambulatory Visit: Payer: Self-pay | Admitting: Orthopedic Surgery

## 2020-04-14 DIAGNOSIS — M24412 Recurrent dislocation, left shoulder: Secondary | ICD-10-CM

## 2020-04-14 DIAGNOSIS — M25312 Other instability, left shoulder: Secondary | ICD-10-CM

## 2020-04-15 ENCOUNTER — Other Ambulatory Visit: Payer: Self-pay

## 2020-04-15 ENCOUNTER — Ambulatory Visit
Admission: RE | Admit: 2020-04-15 | Discharge: 2020-04-15 | Disposition: A | Discharge status: Home / Self Care | Payer: 59 | Source: Ambulatory Visit | Attending: Orthopedic Surgery | Admitting: Orthopedic Surgery

## 2020-04-15 DIAGNOSIS — M24412 Recurrent dislocation, left shoulder: Secondary | ICD-10-CM | POA: Insufficient documentation

## 2020-04-15 DIAGNOSIS — M25312 Other instability, left shoulder: Secondary | ICD-10-CM | POA: Diagnosis present

## 2020-05-02 NOTE — Progress Notes (Signed)
05/03/2020 2:21 PM   Rene Kocher September 23, 1960 [id number]  Referring provider: [facility],  [place] [address]  Chief Complaint  Patient presents with  . Follow-up    16mh follow-up   Urological history 1. Elevated PSA - PSA trend  3.13 in 11/2014  4.27 in 11/2016  3.65 in 05/2017   Started finasteride in 06/2017  2.93 in 08/2017  1.57 (3.14) in 01/2018  1.93 (3.86) in 07/2018   Prostate MRI in 03/2018 was negative  1.11 (2.22) in 10/2018  1.21 (2.42) in 04/2019  1.38 (2.76) in 09/2019  2. ED - SHIM 19 - contributing factors of age, BPH, DM, HTN and HLD - managed with sildenafil 100 mg on-demand-dosing  3. BPH with LU TS - I PSS 11/2 - PVR 11 mL - managed with finasteride 5 mg daily  HPI: TKEANON BEVINSis a 60y.o. male who presents today for a 6 month follow up.    Patient still having spontaneous erections.   He denies any pain or curvature with erections.    SHIM    Row Name 05/03/20 [id number]        SHIM: Over the last 6 months:   How do you rate your confidence that you could get and keep an erection? Moderate     When you had erections with sexual stimulation, how often were your erections hard enough for penetration (entering your partner)? Most Times (much more than half the time)     During sexual intercourse, how often were you able to maintain your erection after you had penetrated (entered) your partner? Most Times (much more than half the time)     During sexual intercourse, how difficult was it to maintain your erection to completion of intercourse? Slightly Difficult     When you attempted sexual intercourse, how often was it satisfactory for you? Most Times (much more than half the time)           SHIM Total Score   SHIM 19            Score: 1-7 Severe ED 8-11 Moderate ED 12-16 Mild-Moderate ED 17-21 Mild ED 22-25 No ED  Patient denies any modifying or aggravating factors.  Patient denies any  gross hematuria, dysuria or suprapubic/flank pain.  Patient denies any fevers, chills, nausea or vomiting.   PSA pending  UA negative for micro heme.    IPSS    Row Name 05/03/20 0900         International Prostate Symptom Score   How often have you had the sensation of not emptying your bladder? Less than half the time     How often have you had to urinate less than every two hours? Less than half the time     How often have you found you stopped and started again several times when you urinated? Less than half the time     How often have you found it difficult to postpone urination? Less than 1 in 5 times     How often have you had a weak urinary stream? Less than 1 in 5 times     How often have you had to strain to start urination? Less than half the time     How many times did you typically get up at night to urinate? 1 Time     Total IPSS Score 11           Quality  of Life due to urinary symptoms   If you were to spend the rest of your life with your urinary condition just the way it is now how would you feel about that? Mostly Satisfied            Score:  1-7 Mild 8-19 Moderate 20-35 Severe   PMH: Past Medical History:  Diagnosis Date  . Anemia, unspecified 05/29/2017   Overview:  Monitoring  . Gastroesophageal reflux disease without esophagitis 11/20/2016   Overview:  Controlled OTC prn treatment only.   Marland Kitchen HTN (hypertension) 08/08/2013  . Hypercholesterolemia 08/08/2013  . Prediabetes 04/22/2014  . Rupture of quadriceps tendon 06/15/2017    Surgical History: Knee surgery 2016  Home Medications:  Allergies as of 05/03/2020   No Known Allergies     Medication List       Accurate as of May 03, 2020  2:21 PM. If you have any questions, ask your nurse or doctor.        STOP taking these medications   sildenafil 20 MG tablet Commonly known as: REVATIO Stopped by: Fernado Brigante, PA-C     TAKE these medications   aspirin EC 81 MG tablet Take by  mouth.   atorvastatin 40 MG tablet Commonly known as: LIPITOR Take 40 mg by mouth daily. What changed: Another medication with the same name was removed. Continue taking this medication, and follow the directions you see here. Changed by: Zara Council, PA-C   finasteride 5 MG tablet Commonly known as: PROSCAR Take 1 tablet by mouth once daily   lisinopril-hydrochlorothiazide 20-25 MG tablet Commonly known as: ZESTORETIC Take by mouth.   metFORMIN 500 MG 24 hr tablet Commonly known as: GLUCOPHAGE-XR Take 500 mg by mouth daily.   sildenafil 100 MG tablet Commonly known as: VIAGRA TAKE 1 TABLET BY MOUTH ONCE DAILY AS NEEDED FOR ERECTILE DYSFUNCTION       Allergies: No Known Allergies  Family History: Family History  Problem Relation Age of Onset  . Prostate cancer Neg Hx   . Kidney Stones Neg Hx     Social History:  reports that he has never smoked. He has never used smokeless tobacco. He reports current alcohol use. He reports that he does not use drugs.  For pertinent review of systems please refer to history of present illness  Physical Exam: BP (!) 150/76   Pulse (!) 58   Ht 6' (1.829 m)   Wt 268 lb (121.6 kg)   BMI 36.35 kg/m   Constitutional:  Well nourished. Alert and oriented, No acute distress. HEENT: [place] AT, mask in place.  Trachea midline Cardiovascular: No clubbing, cyanosis, or edema. Respiratory: Normal respiratory effort, no increased work of breathing. GU: No CVA tenderness.  No bladder fullness or masses.  Patient with circumcised phallus.  Urethral meatus is patent.  No penile discharge. No penile lesions or rashes. Scrotum without lesions, cysts, rashes and/or edema.  Testicles are located scrotally bilaterally. No masses are appreciated in the testicles. Left and right epididymis are normal. Rectal: Patient with  normal sphincter tone. Anus and perineum without scarring or rashes. No rectal masses are appreciated. Prostate is approximately 60 +  grams, could only palpate the apex and the midportion of the gland, no nodules are appreciated. Seminal vesicles could not be palpated.  Lymph: No inguinal adenopathy. Neurologic: Grossly intact, no focal deficits, moving all 4 extremities. Psychiatric: Normal mood and affect.  Laboratory Data: Specimen:  Blood  Ref Range & Units 1 mo ago  Glucose 70 - 110 mg/dL 101   Sodium 136 - 145 mmol/L 141   Potassium 3.6 - 5.1 mmol/L 4.4   Chloride 97 - 109 mmol/L 103   Carbon Dioxide (CO2) 22.0 - 32.0 mmol/L 27.5   Urea Nitrogen (BUN) 7 - 25 mg/dL 17   Creatinine 0.7 - 1.3 mg/dL 1.0   Glomerular Filtration Rate (eGFR), MDRD Estimate >60 mL/min/1.73sq m 93   Calcium 8.7 - 10.3 mg/dL 9.6   AST  8 - 39 U/L 29   ALT  6 - 57 U/L 34   Alk Phos (alkaline Phosphatase) 34 - 104 U/L 67   Albumin 3.5 - 4.8 g/dL 4.4   Bilirubin, Total 0.3 - 1.2 mg/dL 0.7   Protein, Total 6.1 - 7.9 g/dL 7.0   A/G Ratio 1.0 - 5.0 gm/dL 1.7   Resulting Agency  [facility] - [facility]  Specimen Collected: 03/09/20 9:35 AM Last Resulted: 03/09/20 5:06 PM  Received From: [facility]  Result Received: 04/14/20 8:24 AM   Specimen:  Blood  Ref Range & Units 1 mo ago  Hemoglobin A1C 4.2 - 5.6 % 6.2High   Average Blood Glucose (Calc) mg/dL 131   Resulting Agency  [facility] - [facility]   Narrative  Normal Range:  4.2 - 5.6%  Increased Risk: 5.7 - 6.4%  Diabetes:    >= 6.5%  Glycemic Control for adults with diabetes: <7%  Specimen Collected: 03/09/20 9:35 AM Last Resulted: 03/09/20 12:53 PM  Received From: [facility]  Result Received: 03/12/20 12:20 PM   Specimen:  Blood  Ref Range & Units 1 mo ago  Cholesterol, Total 100 - 200 mg/dL 206High   Triglyceride 35 - 199 mg/dL 68   HDL (High Density Lipoprotein) Cholesterol 29.0 - 71.0 mg/dL 61.0   LDL Calculated 0 - 130 mg/dL 131High   VLDL Cholesterol mg/dL 14   Cholesterol/HDL Ratio  3.4   Resulting Agency   [facility] - [facility]  Specimen Collected: 03/09/20 9:35 AM Last Resulted: 03/09/20 5:06 PM  Received From: [facility]  Result Received: 03/12/20 12:20 PM   Specimen:  Blood  Ref Range & Units 1 mo ago  WBC (White Blood Cell Count) 4.1 - 10.2 10^3/uL 4.1   RBC (Red Blood Cell Count) 4.69 - 6.13 10^6/uL 5.57   Hemoglobin 14.1 - 18.1 gm/dL 13.3Low   Hematocrit 40.0 - 52.0 % 43.9   MCV (Mean Corpuscular Volume) 80.0 - 100.0 fl 78.8Low   MCH (Mean Corpuscular Hemoglobin) 27.0 - 31.2 pg 23.9Low   MCHC (Mean Corpuscular Hemoglobin Concentration) 32.0 - 36.0 gm/dL 30.3Low   Platelet Count 150 - 450 10^3/uL 230   RDW-CV (Red Cell Distribution Width) 11.6 - 14.8 % 14.6   MPV (Mean Platelet Volume) 9.4 - 12.4 fl 8.9Low   Neutrophils 1.50 - 7.80 10^3/uL 2.20   Lymphocytes 1.00 - 3.60 10^3/uL 1.50   Mixed Count 0.10 - 0.90 10^3/uL 0.40   Neutrophil % 32.0 - 70.0 % 53.1   Lymphocyte % 10.0 - 50.0 % 37.7   Mixed % 3.0 - 14.4 % 9.2   Resulting Agency  [facility] - [facility]  Specimen Collected: 03/09/20 9:35 AM Last Resulted: 03/09/20 10:20 AM  Received From: [facility]  Result Received: 03/12/20 12:20 PM   Specimen:  Blood  Ref Range & Units 1 mo ago  Thyroid Stimulating Hormone (TSH) 0.450-5.330 uIU/ml uIU/mL 0.651   Resulting Agency  [facility]  Specimen Collected: 03/09/20 9:35 AM Last Resulted: 03/09/20 4:26 PM  Received From: [facility]  Result Received: 03/12/20 12:20 PM   Urinalysis Component     Latest Ref Rng & Units 05/03/2020  Color, Urine     YELLOW YELLOW  Appearance     CLEAR CLEAR  Specific Gravity, Urine     1.005 - 1.030 1.025  pH     5.0 - 8.0 6.0  Glucose, UA     NEGATIVE mg/dL NEGATIVE  Hgb urine dipstick     NEGATIVE NEGATIVE  Bilirubin Urine     NEGATIVE NEGATIVE  Ketones, ur     NEGATIVE mg/dL NEGATIVE  Protein     NEGATIVE mg/dL TRACE (A)  Nitrite      NEGATIVE NEGATIVE  Leukocytes,Ua     NEGATIVE NEGATIVE  Squamous Epithelial / LPF     0 - 5 0-5  WBC, UA     0 - 5 WBC/hpf 6-10  RBC / HPF     0 - 5 RBC/hpf NONE SEEN  Bacteria, UA     NONE SEEN FEW (A)  Mucus      PRESENT   I have reviewed the labs.  Pertinent Imaging Results for ZORAWAR, STROLLO (MRN [id number]) as of 05/03/2020 09:20  Ref. Range 05/03/2020 09:15  Scan Result Unknown 11 ml     Assessment & Plan:    1. Erectile dysfunction SHIM score is 19, it is improved Continue the sildenafil  RTC in 12 months for repeat SHIM score and exam   2. BPH with LUTS IPSS score is 11/2, it is worse Continue conservative management, avoiding bladder irritants and timed voiding's Continue the finasteride 5 mg daily PCP to get PSA in 6 months  RTC in 12 months for I PSS, PSA and exam - if PSA is stable    Return in about 1 year (around 05/03/2021) for IPSS, SHIM, PSA and exam.  These notes generated with voice recognition software. I apologize for typographical errors.  Zara Council, PA-C  [facility] [address] [place], [place] [address] [phone]

## 2020-05-03 ENCOUNTER — Other Ambulatory Visit: Payer: Self-pay | Admitting: *Deleted

## 2020-05-03 ENCOUNTER — Other Ambulatory Visit
Admission: RE | Admit: 2020-05-03 | Discharge: 2020-05-03 | Disposition: A | Discharge status: Home / Self Care | Payer: 59 | Attending: Urology | Admitting: Urology

## 2020-05-03 ENCOUNTER — Other Ambulatory Visit: Payer: Self-pay

## 2020-05-03 ENCOUNTER — Encounter: Payer: Self-pay | Admitting: Urology

## 2020-05-03 ENCOUNTER — Ambulatory Visit: Payer: 59 | Admitting: Urology

## 2020-05-03 VITALS — BP 150/76 | HR 58 | Ht 72.0 in | Wt 268.0 lb

## 2020-05-03 DIAGNOSIS — Z87898 Personal history of other specified conditions: Secondary | ICD-10-CM

## 2020-05-03 DIAGNOSIS — N401 Enlarged prostate with lower urinary tract symptoms: Secondary | ICD-10-CM | POA: Insufficient documentation

## 2020-05-03 DIAGNOSIS — N138 Other obstructive and reflux uropathy: Secondary | ICD-10-CM

## 2020-05-03 DIAGNOSIS — N529 Male erectile dysfunction, unspecified: Secondary | ICD-10-CM

## 2020-05-03 LAB — URINALYSIS, COMPLETE (UACMP) WITH MICROSCOPIC
Bilirubin Urine: NEGATIVE
Glucose, UA: NEGATIVE mg/dL
Hgb urine dipstick: NEGATIVE
Ketones, ur: NEGATIVE mg/dL
Leukocytes,Ua: NEGATIVE
Nitrite: NEGATIVE
RBC / HPF: NONE SEEN RBC/hpf (ref 0–5)
Specific Gravity, Urine: 1.025 (ref 1.005–1.030)
pH: 6 (ref 5.0–8.0)

## 2020-05-03 LAB — PSA: Prostatic Specific Antigen: 1.33 ng/mL (ref 0.00–4.00)

## 2020-05-03 LAB — BLADDER SCAN AMB NON-IMAGING: Scan Result: 11

## 2020-06-27 ENCOUNTER — Other Ambulatory Visit: Payer: Self-pay | Admitting: Urology

## 2020-09-15 ENCOUNTER — Other Ambulatory Visit: Payer: Self-pay | Admitting: Urology

## 2020-09-29 ENCOUNTER — Other Ambulatory Visit: Payer: Self-pay | Admitting: Urology

## 2020-12-25 ENCOUNTER — Other Ambulatory Visit: Payer: Self-pay | Admitting: Urology

## 2021-04-02 ENCOUNTER — Other Ambulatory Visit: Payer: Self-pay | Admitting: Urology

## 2021-05-06 NOTE — Progress Notes (Deleted)
05/09/2021 8:07 AM   Timothy Price 02-01-1961 [id number]  Referring provider: [facility] [address] [place],  [place] [address]  No chief complaint on file.  Urological history: 1. Elevated PSA - PSA trend  3.13 in 11/2014  4.27 in 11/2016  3.65 in 05/2017   Started finasteride in 06/2017  2.93 in 08/2017  1.57 (3.14) in 01/2018  1.93 (3.86) in 07/2018   Prostate MRI in 03/2018 was negative  1.11 (2.22) in 10/2018  1.21 (2.42) in 04/2019  1.38 (2.76) in 09/2019  1.33 (2.66) in 04/2020  1.45 (2.90) in 09/2020   2. ED -contributing factors of age, BPH, DM, HTN and HLD -SHIM *** -managed with sildenafil 100 mg, on-demand-dosing  3. BPH with LU TS -PSA pending -I PSS *** -managed with finasteride 5 mg daily  HPI: Timothy Price is a 61 y.o. male who presents today for a 6 month follow up.         Score: 1-7 Severe ED 8-11 Moderate ED 12-16 Mild-Moderate ED 17-21 Mild ED 22-25 No ED      Score:  1-7 Mild 8-19 Moderate 20-35 Severe   PMH: Past Medical History:  Diagnosis Date   Anemia, unspecified 05/29/2017   Overview:  Monitoring   Gastroesophageal reflux disease without esophagitis 11/20/2016   Overview:  Controlled OTC prn treatment only.    HTN (hypertension) 08/08/2013   Hypercholesterolemia 08/08/2013   Prediabetes 04/22/2014   Rupture of quadriceps tendon 06/15/2017    Surgical History: Knee surgery 2016  Home Medications:  Allergies as of 05/09/2021   No Known Allergies      Medication List        Accurate as of May 06, 2021  8:07 AM. If you have any questions, ask your nurse or doctor.          aspirin EC 81 MG tablet Take by mouth.   atorvastatin 40 MG tablet Commonly known as: LIPITOR Take 40 mg by mouth daily.   finasteride 5 MG tablet Commonly known as: PROSCAR Take 1 tablet by mouth once daily   lisinopril-hydrochlorothiazide 20-25 MG tablet Commonly known as: ZESTORETIC Take by mouth.    metFORMIN 500 MG 24 hr tablet Commonly known as: GLUCOPHAGE-XR Take 500 mg by mouth daily.   sildenafil 100 MG tablet Commonly known as: VIAGRA TAKE 1 TABLET BY MOUTH ONCE DAILY AS NEEDED FOR ERECTILE DYSFUNCTION        Allergies: No Known Allergies  Family History: Family History  Problem Relation Age of Onset   Prostate cancer Neg Hx    Kidney Stones Neg Hx     Social History:  reports that he has never smoked. He has never used smokeless tobacco. He reports current alcohol use. He reports that he does not use drugs.  For pertinent review of systems please refer to history of present illness  Physical Exam: There were no vitals taken for this visit.  Constitutional:  Well nourished. Alert and oriented, No acute distress. HEENT: [place] AT, moist mucus membranes.  Trachea midline Cardiovascular: No clubbing, cyanosis, or edema. Respiratory: Normal respiratory effort, no increased work of breathing. GU: No CVA tenderness.  No bladder fullness or masses.  Patient with circumcised/uncircumcised phallus. ***Foreskin easily retracted***  Urethral meatus is patent.  No penile discharge. No penile lesions or rashes. Scrotum without lesions, cysts, rashes and/or edema.  Testicles are located scrotally bilaterally. No masses are appreciated in the testicles. Left and right epididymis are normal. Rectal:  Patient with  normal sphincter tone. Anus and perineum without scarring or rashes. No rectal masses are appreciated. Prostate is approximately *** grams, *** nodules are appreciated. Seminal vesicles are normal. Neurologic: Grossly intact, no focal deficits, moving all 4 extremities. Psychiatric: Normal mood and affect.   Laboratory Data: Glucose 70 - 110 mg/dL 96   Sodium 136 - 145 mmol/L 139   Potassium 3.6 - 5.1 mmol/L 4.8   Chloride 97 - 109 mmol/L 101   Carbon Dioxide (CO2) 22.0 - 32.0 mmol/L 30.7   Calcium 8.7 - 10.3 mg/dL 10.0   Urea Nitrogen (BUN) 7 - 25 mg/dL 17   Creatinine  0.7 - 1.3 mg/dL 1.2   Glomerular Filtration Rate (eGFR), MDRD Estimate >60 mL/min/1.73sq m 75   BUN/Crea Ratio 6.0 - 20.0 14.2   Anion Gap w/K 6.0 - 16.0 12.1   Resulting Agency  [facility] - LAB  Specimen Collected: 04/29/21 14:26 Last Resulted: 05/02/21 12:06  Received From: [facility]  Result Received: 05/06/21 08:06   Hemoglobin A1C 4.2 - 5.6 % 6.5 High    Average Blood Glucose (Calc) mg/dL 140   Resulting [facility]  Narrative Performed by [facility] - LAB Normal Range:    4.2 - 5.6%  Increased Risk:  5.7 - 6.4%  Diabetes:        >= 6.5%  Glycemic Control for adults with diabetes:  <7%   Specimen Collected: 04/29/21 14:26 Last Resulted: 05/02/21 14:31  Received From: [facility]  Result Received: 05/06/21 08:06   PSA (Prostate Specific Antigen), Total 0.10 - 4.00 ng/mL 1.45   Resulting Agency  [facility] - LAB  Narrative Performed by [facility] - LAB Test results were determined with Beckman Coulter Hybritech Assay. Values obtained with different assay methods cannot be used interchangeably in serial testing. Assay results should not be interpreted as absolute evidence of the presence or absence of malignant disease Specimen Collected: 09/09/20 09:39 Last Resulted: 09/09/20 15:37  Received From: [facility]  Result Received: 09/27/20 12:21   WBC (White Blood Cell Count) 4.1 - 10.2 103/uL 4.3   RBC (Red Blood Cell Count) 4.69 - 6.13 106/uL 5.54   Hemoglobin 14.1 - 18.1 gm/dL 13.4 Low    Hematocrit 40.0 - 52.0 % 43.8   MCV (Mean Corpuscular Volume) 80.0 - 100.0 fl 79.1 Low    MCH (Mean Corpuscular Hemoglobin) 27.0 - 31.2 pg 24.2 Low    MCHC (Mean Corpuscular Hemoglobin Concentration) 32.0 - 36.0 gm/dL 30.6 Low    Platelet Count 150 - 450 103/uL 228   RDW-CV (Red Cell Distribution Width) 11.6 - 14.8 % 14.6   MPV (Mean Platelet Volume) 9.4 - 12.4 fl 9.5    Neutrophils 1.50 - 7.80 103/uL 2.40   Lymphocytes 1.00 - 3.60 103/uL 1.70   Mixed Count 0.10 - 0.90 103/uL 0.20   Neutrophil % 32.0 - 70.0 % 55.8   Lymphocyte % 10.0 - 50.0 % 39.1   Mixed % 3.0 - 14.4 % 5.1   Resulting Agency  [facility] - LAB  Specimen Collected: 09/09/20 09:39 Last Resulted: 09/09/20 11:58  Received From: [facility]  Result Received: 09/27/20 12:21   Thyroid Stimulating Hormone (TSH) 0.450-5.330 uIU/ml uIU/mL 0.652   Resulting Agency  [facility] - LAB  Specimen Collected: 09/09/20 09:39 Last Resulted: 09/09/20 15:37  Received From: [facility]  Result Received: 09/27/20  12:21   Hemoglobin A1C 4.2 - 5.6 % 6.4 High    Average Blood Glucose (Calc) mg/dL 137   Resulting Agency  [facility] - LAB  Narrative Performed by [facility] - LAB Normal Range:    4.2 - 5.6%  Increased Risk:  5.7 - 6.4%  Diabetes:        >= 6.5%  Glycemic Control for adults with diabetes:  <7%   Specimen Collected: 09/09/20 09:39 Last Resulted: 09/09/20 12:17  Received From: [facility]  Result Received: 09/27/20 12:21   Cholesterol, Total 100 - 200 mg/dL 198   Triglyceride 35 - 199 mg/dL 70   HDL (High Density Lipoprotein) Cholesterol 29.0 - 71.0 mg/dL 55.7   LDL Calculated 0 - 130 mg/dL 128   VLDL Cholesterol mg/dL 14   Cholesterol/HDL Ratio  3.6   Resulting Agency  [facility] - LAB  Specimen Collected: 09/09/20 09:39 Last Resulted: 09/09/20 15:36  Received From: [facility]  Result Received: 09/27/20 12:21   Glucose 70 - 110 mg/dL 101   Sodium 136 - 145 mmol/L 139   Potassium 3.6 - 5.1 mmol/L 4.4   Chloride 97 - 109 mmol/L 102   Carbon Dioxide (CO2) 22.0 - 32.0 mmol/L 29.1   Urea Nitrogen (BUN) 7 - 25 mg/dL 19   Creatinine 0.7 - 1.3 mg/dL 1.2   Glomerular Filtration Rate (eGFR), MDRD Estimate >60 mL/min/1.73sq m 75   Calcium 8.7 - 10.3 mg/dL 9.6   AST  8  - 39 U/L 19   ALT  6 - 57 U/L 29   Alk Phos (alkaline Phosphatase) 34 - 104 U/L 69   Albumin 3.5 - 4.8 g/dL 4.3   Bilirubin, Total 0.3 - 1.2 mg/dL 0.7   Protein, Total 6.1 - 7.9 g/dL 6.9   A/G Ratio 1.0 - 5.0 gm/dL 1.7   Resulting Agency  [facility] [facility] - LAB  Specimen Collected: 09/09/20 09:39 Last Resulted: 09/09/20 15:36  Received From: [place]  Result Received: 09/27/20 12:21  I have reviewed the labs.  Pertinent Imaging ***    Assessment & Plan:    1. Erectile dysfunction -***  2. BPH with LUTS -PSA stable -DRE benign -UA benign -PVR < 300 cc -symptoms - *** -most bothersome symptoms are *** -continue conservative management, avoiding bladder irritants and timed voiding's -Initiate alpha-blocker (***), discussed side effects *** -Initiate 5 alpha reductase inhibitor (***), discussed side effects *** -Continue tamsulosin 0.4 mg daily, alfuzosin 10 mg daily, Rapaflo 8 mg daily, terazosin, doxazosin, Cialis 5 mg daily and finasteride 5 mg daily, dutasteride 0.5 mg daily***:refills given -Cannot tolerate medication or medication failure, schedule cystoscopy ***     No follow-ups on file.  These notes generated with voice recognition software. I apologize for typographical errors.  Zara Council, PA-C  [facility] [address] [place],  [address] [phone]

## 2021-05-06 NOTE — Progress Notes (Incomplete)
05/06/21 8:57 AM   Timothy Price June 27, 1960 [id number]  Referring provider:  [facility] [address] [place],  [place] [address] No chief complaint on file.   Urological history: Elevated PSA  - PSA trend             3.13 in 11/2014             4.27 in 11/2016             3.65 in 05/2017             Started finasteride in 06/2017             2.93 in 08/2017             1.57 (3.14) in 01/2018              Prostate MRI in 03/2018 was negative             1.93 (3.86) in 07/2018             1.11 (2.22) in 10/2018             1.21 (2.42) in 04/2019             1.38 (2.76) in 09/2019  2. ED  - SHIM  - contributing factors of age, BPH, DM, HTN and HLD - managed with sildenafil 100 mg on-demand-dosing  3. BPH with LUTS  - IPSS*** - PVR *** - managed with finasteride 5 mg daily  HPI: Timothy Price is a 61 y.o.male who presents today for 1 year follow-up with IPSS, PVR, and SHIM.   He reports ***  He denies ***      PMH: Past Medical History:  Diagnosis Date   Anemia, unspecified 05/29/2017   Overview:  Monitoring   Gastroesophageal reflux disease without esophagitis 11/20/2016   Overview:  Controlled OTC prn treatment only.    HTN (hypertension) 08/08/2013   Hypercholesterolemia 08/08/2013   Prediabetes 04/22/2014   Rupture of quadriceps tendon 06/15/2017    Surgical History: Past Surgical History:  Procedure Laterality Date   KNEE SURGERY  2016   tendon repair    Home Medications:  Allergies as of 05/09/2021   No Known Allergies      Medication List        Accurate as of May 06, 2021  8:57 AM. If you have any questions, ask your nurse or doctor.          aspirin EC 81 MG tablet Take by mouth.   atorvastatin 40 MG tablet Commonly known as: LIPITOR Take 40 mg by mouth daily.   finasteride 5 MG tablet Commonly known as: PROSCAR Take 1 tablet by mouth once daily   lisinopril-hydrochlorothiazide 20-25 MG tablet Commonly known  as: ZESTORETIC Take by mouth.   metFORMIN 500 MG 24 hr tablet Commonly known as: GLUCOPHAGE-XR Take 500 mg by mouth daily.   sildenafil 100 MG tablet Commonly known as: VIAGRA TAKE 1 TABLET BY MOUTH ONCE DAILY AS NEEDED FOR ERECTILE DYSFUNCTION        Allergies:  No Known Allergies  Family History: Family History  Problem Relation Age of Onset   Prostate cancer Neg Hx    Kidney Stones Neg Hx     Social History:  reports that he has never smoked. He has never used smokeless tobacco. He reports current alcohol use. He reports that he does not use drugs.   Physical Exam: There were no vitals taken for this  visit.  Constitutional:  Alert and oriented, No acute distress. HEENT: [place] AT, moist mucus membranes.  Trachea midline, no masses. Cardiovascular: No clubbing, cyanosis, or edema. Respiratory: Normal respiratory effort, no increased work of breathing. GI: Abdomen is soft, nontender, nondistended, no abdominal masses GU: No CVA tenderness Lymph: No cervical or inguinal lymphadenopathy. Skin: No rashes, bruises or suspicious lesions. Neurologic: Grossly intact, no focal deficits, moving all 4 extremities. Psychiatric: Normal mood and affect.  Laboratory Data:  Ref Range & Units 7 d ago  Glucose 70 - 110 mg/dL 96   Sodium 136 - 145 mmol/L 139   Potassium 3.6 - 5.1 mmol/L 4.8   Chloride 97 - 109 mmol/L 101   Carbon Dioxide (CO2) 22.0 - 32.0 mmol/L 30.7   Calcium 8.7 - 10.3 mg/dL 10.0   Urea Nitrogen (BUN) 7 - 25 mg/dL 17   Creatinine 0.7 - 1.3 mg/dL 1.2   Glomerular Filtration Rate (eGFR), MDRD Estimate >60 mL/min/1.73sq m 75   BUN/Crea Ratio 6.0 - 20.0 14.2   Anion Gap w/K 6.0 - 16.0 12.1   Resulting Agency  [facility] - LAB  Specimen Collected: 04/29/21 14:26 Last Resulted: 05/02/21 12:06  Received From: [facility]  Result Received: 05/06/21 08:06    Ref Range & Units 7 d ago  Hemoglobin A1C 4.2 - 5.6 % 6.5 High    Average Blood  Glucose (Calc) mg/dL 140   Resulting [facility]  Narrative Performed by [facility] - LAB Normal Range:    4.2 - 5.6%  Increased Risk:  5.7 - 6.4%  Diabetes:        >= 6.5%  Glycemic Control for adults with diabetes:  <7%   Specimen Collected: 04/29/21 14:26 Last Resulted: 05/02/21 14:31  Received From: [facility]  Result Received: 05/06/21 08:06    Ref Range & Units 7 mo ago  PSA (Prostate Specific Antigen), Total 0.10 - 4.00 ng/mL 1.45   Resulting Agency  [facility] - LAB  Narrative Performed by [facility] - LAB Test results were determined with Beckman Coulter Hybritech Assay. Values obtained with different assay methods cannot be used interchangeably in serial testing. Assay results should not be interpreted as absolute evidence of the presence or absence of malignant disease Specimen Collected: 09/09/20 09:39 Last Resulted: 09/09/20 15:37  Received From: [facility]  Result Received: 09/27/20 12:21   Urinalysis   Pertinent Imaging: PVR***  Assessment & Plan:     No follow-ups on file.  [facility] [address] [place], [place] [address] [phone]  I,Kailey Littlejohn,acting as a scribe for [facility], PA-C.,have documented all relevant documentation on the behalf of Timothy MCGOWAN, PA-C,as directed by  [facility], PA-C while in the presence of [place], PA-C.

## 2021-05-09 ENCOUNTER — Ambulatory Visit: Payer: 59 | Admitting: Urology

## 2021-07-05 ENCOUNTER — Other Ambulatory Visit: Payer: Self-pay | Admitting: Urology

## 2021-07-12 ENCOUNTER — Other Ambulatory Visit: Payer: Self-pay | Admitting: Urology

## 2021-08-12 ENCOUNTER — Telehealth: Payer: Self-pay

## 2021-08-12 NOTE — Telephone Encounter (Signed)
Incoming call on triage line from pt regarding a refill on finasteride. Pt states he has not been able to get his finasteride refilled and wanted to know if he still needs to be taking it. After chart review pt has not been seen since 05/03/20. Pt was scheduled for annual f/u in Feb 2023 but canceled. Advised pt he would need to make an appt to be seen to discuss this w [facility] [place]. Appt made, pt confirmed and expressed understanding.  ?

## 2021-08-29 NOTE — Progress Notes (Unsigned)
08/30/21 3:32 PM   Timothy Price 1961/01/11 [id number]  Referring provider:  [facility] [address] [place],  [place] [address]  Urological history: Elevated PSA  - PSA trend             3.13 in 11/2014             4.27 in 11/2016             3.65 in 05/2017             Started finasteride in 06/2017             2.93 in 08/2017             1.57 (3.14) in 01/2018              Prostate MRI in 03/2018 was negative             1.93 (3.86) in 07/2018             1.11 (2.22) in 10/2018             1.21 (2.42) in 04/2019             1.38 (2.76) in 09/2019  1.45 (2.90) in 09/2020  2. ED  - contributing factors of age, BPH, DM, HTN and HLD - SHIM 20 - managed with sildenafil 100 mg on-demand-dosing  3. BPH with LUTS  - I PSS 7/1 - managed with finasteride 5 mg daily  HPI: Timothy Price is a 61 y.o.male who presents today for 1 year follow-up with IPSS, PVR, and SHIM.   Patient still having spontaneous erections.  He denies any pain or curvature with erections.   He continues to have satisfactory intercourse with sildenafil 100 mg, on-demand dosing.   SHIM     Row Name 08/30/21 1522         SHIM: Over the last 6 months:   How do you rate your confidence that you could get and keep an erection? Moderate     When you had erections with sexual stimulation, how often were your erections hard enough for penetration (entering your partner)? Most Times (much more than half the time)     During sexual intercourse, how often were you able to maintain your erection after you had penetrated (entered) your partner? Most Times (much more than half the time)     During sexual intercourse, how difficult was it to maintain your erection to completion of intercourse? Not Difficult     When you attempted sexual intercourse, how often was it satisfactory for you? Most Times (much more than half the time)       SHIM Total Score   SHIM 20              Score: 1-7 Severe  ED 8-11 Moderate ED 12-16 Mild-Moderate ED 17-21 Mild ED 22-25 No ED   He has no urinary complaints at this visit.  Patient denies any modifying or aggravating factors.  Patient denies any gross hematuria, dysuria or suprapubic/flank pain.  Patient denies any fevers, chills, nausea or vomiting.   He continues to take finasteride 5 mg daily.   IPSS     Row Name 08/30/21 1500         International Prostate Symptom Score   How often have you had the sensation of not emptying your bladder? Less than 1 in 5     How often have you had to urinate less than  every two hours? Less than 1 in 5 times     How often have you found you stopped and started again several times when you urinated? Not at All     How often have you found it difficult to postpone urination? Less than 1 in 5 times     How often have you had a weak urinary stream? Less than 1 in 5 times     How often have you had to strain to start urination? Less than half the time     How many times did you typically get up at night to urinate? 1 Time     Total IPSS Score 7       Quality of Life due to urinary symptoms   If you were to spend the rest of your life with your urinary condition just the way it is now how would you feel about that? Pleased              Score:  1-7 Mild 8-19 Moderate 20-35 Severe   PMH: Past Medical History:  Diagnosis Date   Anemia, unspecified 05/29/2017   Overview:  Monitoring   Gastroesophageal reflux disease without esophagitis 11/20/2016   Overview:  Controlled OTC prn treatment only.    HTN (hypertension) 08/08/2013   Hypercholesterolemia 08/08/2013   Prediabetes 04/22/2014   Rupture of quadriceps tendon 06/15/2017    Surgical History: Past Surgical History:  Procedure Laterality Date   KNEE SURGERY  2016   tendon repair    Home Medications:  Allergies as of 08/30/2021   No Known Allergies      Medication List        Accurate as of Aug 30, 2021  3:32 PM. If you have any  questions, ask your nurse or doctor.          aspirin EC 81 MG tablet Take by mouth.   atorvastatin 40 MG tablet Commonly known as: LIPITOR Take 40 mg by mouth daily.   finasteride 5 MG tablet Commonly known as: PROSCAR Take 1 tablet (5 mg total) by mouth daily.   lisinopril-hydrochlorothiazide 20-25 MG tablet Commonly known as: ZESTORETIC Take by mouth.   metFORMIN 500 MG 24 hr tablet Commonly known as: GLUCOPHAGE-XR Take 500 mg by mouth daily.   sildenafil 100 MG tablet Commonly known as: VIAGRA Take 1 tablet (100 mg total) by mouth daily as needed for erectile dysfunction.        Allergies:  No Known Allergies  Family History: Family History  Problem Relation Age of Onset   Prostate cancer Neg Hx    Kidney Stones Neg Hx     Social History:  reports that he has never smoked. He has never used smokeless tobacco. He reports current alcohol use. He reports that he does not use drugs.   Physical Exam: BP 139/87   Pulse 80   Ht 6' (1.829 m)   Wt 261 lb (118.4 kg)   BMI 35.40 kg/m   Constitutional:  Well nourished. Alert and oriented, No acute distress. HEENT: [place] AT, moist mucus membranes.  Trachea midline Cardiovascular: No clubbing, cyanosis, or edema. Respiratory: Normal respiratory effort, no increased work of breathing. GU and Rectal: Patient deferred. Neurologic: Grossly intact, no focal deficits, moving all 4 extremities. Psychiatric: Normal mood and affect.   Laboratory Data: Glucose 70 - 110 mg/dL 96   Sodium 136 - 145 mmol/L 139   Potassium 3.6 - 5.1 mmol/L 4.8   Chloride 97 - 109 mmol/L 101  Carbon Dioxide (CO2) 22.0 - 32.0 mmol/L 30.7   Calcium 8.7 - 10.3 mg/dL 10.0   Urea Nitrogen (BUN) 7 - 25 mg/dL 17   Creatinine 0.7 - 1.3 mg/dL 1.2   Glomerular Filtration Rate (eGFR), MDRD Estimate >60 mL/min/1.73sq m 75   BUN/Crea Ratio 6.0 - 20.0 14.2   Anion Gap w/K 6.0 - 16.0 12.1   Resulting Agency  [facility] - LAB  Specimen  Collected: 04/29/21 14:26 Last Resulted: 05/02/21 12:06  Received From: [facility]  Result Received: 07/06/21 08:16   Hemoglobin A1C 4.2 - 5.6 % 6.5 High    Average Blood Glucose (Calc) mg/dL 140   Resulting [facility] - LAB  Narrative Performed by [facility] - LAB Normal Range:    4.2 - 5.6%  Increased Risk:  5.7 - 6.4%  Diabetes:        >= 6.5%  Glycemic Control for adults with diabetes:  <7%   Specimen Collected: 04/29/21 14:26 Last Resulted: 05/02/21 14:31  Received From: [facility]  Result Received: 07/06/21 08:16   WBC (White Blood Cell Count) 4.1 - 10.2 10^3/uL 4.3   RBC (Red Blood Cell Count) 4.69 - 6.13 10^6/uL 5.54   Hemoglobin 14.1 - 18.1 gm/dL 13.4 Low    Hematocrit 40.0 - 52.0 % 43.8   MCV (Mean Corpuscular Volume) 80.0 - 100.0 fl 79.1 Low    MCH (Mean Corpuscular Hemoglobin) 27.0 - 31.2 pg 24.2 Low    MCHC (Mean Corpuscular Hemoglobin Concentration) 32.0 - 36.0 gm/dL 30.6 Low    Platelet Count 150 - 450 10^3/uL 228   RDW-CV (Red Cell Distribution Width) 11.6 - 14.8 % 14.6   MPV (Mean Platelet Volume) 9.4 - 12.4 fl 9.5   Neutrophils 1.50 - 7.80 10^3/uL 2.40   Lymphocytes 1.00 - 3.60 10^3/uL 1.70   Mixed Count 0.10 - 0.90 10^3/uL 0.20   Neutrophil % 32.0 - 70.0 % 55.8   Lymphocyte % 10.0 - 50.0 % 39.1   Mixed % 3.0 - 14.4 % 5.1   Resulting Agency  [facility] - LAB  Specimen Collected: 09/09/20 09:39 Last Resulted: 09/09/20 11:58  Received From: [place] [facility]  Result Received: 09/27/20 12:21   Thyroid Stimulating Hormone (TSH) 0.450-5.330 uIU/ml uIU/mL 0.652   Resulting Agency  [facility] - LAB  Specimen Collected: 09/09/20 09:39 Last Resulted: 09/09/20 15:37  Received From: [facility]  Result Received: 09/27/20 12:21   Cholesterol, Total 100 - 200 mg/dL 198   Triglyceride 35 - 199 mg/dL 70   HDL (High Density Lipoprotein) Cholesterol 29.0 -  71.0 mg/dL 55.7   LDL Calculated 0 - 130 mg/dL 128   VLDL Cholesterol mg/dL 14   Cholesterol/HDL Ratio  3.6   Resulting Agency  [facility] - LAB  Specimen Collected: 09/09/20 09:39 Last Resulted: 09/09/20 15:36  Received From: [facility]  Result Received: 09/27/20 12:21  I have reviewed the labs.    Pertinent Imaging: N/A  Assessment & Plan:    1. BPH with LUTS -PSA pending -DRE deferred by patient -explained that AUA guidelines for prostate cancer screening include both the DRE and PSA as some prostate cancers do not change the value of the PSA reading-he understands this risk and still defers exam -continue conservative management, avoiding bladder irritants and timed voiding's -Continue finasteride 5 mg daily  2. ED -Continue sildenafil 100 mg, on-demand dosing  Return in about  1 year (around 08/31/2022) for IPSS, SHIM, PSA and exam.   Zara Council, [facility]   [facility] [address], [place], [place] [address] [phone]

## 2021-08-30 ENCOUNTER — Encounter: Payer: Self-pay | Admitting: Urology

## 2021-08-30 ENCOUNTER — Ambulatory Visit: Payer: 59 | Admitting: Urology

## 2021-08-30 VITALS — BP 139/87 | HR 80 | Ht 72.0 in | Wt 261.0 lb

## 2021-08-30 DIAGNOSIS — N138 Other obstructive and reflux uropathy: Secondary | ICD-10-CM

## 2021-08-30 DIAGNOSIS — N529 Male erectile dysfunction, unspecified: Secondary | ICD-10-CM | POA: Diagnosis not present

## 2021-08-30 DIAGNOSIS — N401 Enlarged prostate with lower urinary tract symptoms: Secondary | ICD-10-CM | POA: Diagnosis not present

## 2021-08-30 MED ORDER — SILDENAFIL CITRATE 100 MG PO TABS: 100.0000 mg | ORAL_TABLET | Freq: Every day | ORAL | 3 refills | Status: AC | PRN

## 2021-08-30 MED ORDER — FINASTERIDE 5 MG PO TABS: 5.0000 mg | ORAL_TABLET | Freq: Every day | ORAL | 3 refills | Status: DC

## 2021-08-31 ENCOUNTER — Other Ambulatory Visit: Payer: Self-pay

## 2021-08-31 ENCOUNTER — Telehealth: Payer: Self-pay

## 2021-08-31 DIAGNOSIS — Z87898 Personal history of other specified conditions: Secondary | ICD-10-CM

## 2021-08-31 LAB — PSA: Prostate Specific Ag, Serum: 9.1 ng/mL — ABNORMAL HIGH (ref 0.0–4.0)

## 2021-08-31 NOTE — Telephone Encounter (Signed)
-----   Message from Nori Riis, PA-C sent at 08/31/2021  8:10 AM EDT ----- Please let Timothy Price know that his PSA is elevated at 9.1.  This is concerning and will need to be repeated next week to confirm the elevation is sustained.  Please have him return next week for a free and total PSA.

## 2021-08-31 NOTE — Telephone Encounter (Signed)
Notified pt of results. Pt would like to go to [facility] lab for repeat testing. He states he will be going on Monday 6/5. Lab order placed.

## 2021-09-05 ENCOUNTER — Other Ambulatory Visit
Admission: RE | Admit: 2021-09-05 | Discharge: 2021-09-05 | Disposition: A | Discharge status: Home / Self Care | Payer: 59 | Attending: Urology | Admitting: Urology

## 2021-09-05 DIAGNOSIS — Z87898 Personal history of other specified conditions: Secondary | ICD-10-CM | POA: Insufficient documentation

## 2021-09-06 ENCOUNTER — Other Ambulatory Visit: Payer: Self-pay | Admitting: Urology

## 2021-09-06 DIAGNOSIS — R972 Elevated prostate specific antigen [PSA]: Secondary | ICD-10-CM

## 2021-09-06 LAB — PSA, TOTAL AND FREE
PSA, Free Pct: 10.5 %
PSA, Free: 0.61 ng/mL
Prostate Specific Ag, Serum: 5.8 ng/mL — ABNORMAL HIGH (ref 0.0–4.0)

## 2021-09-06 NOTE — Progress Notes (Unsigned)
Orders in for prostate MRI 

## 2021-09-22 ENCOUNTER — Ambulatory Visit
Admission: RE | Admit: 2021-09-22 | Discharge: 2021-09-22 | Disposition: A | Discharge status: Home / Self Care | Payer: 59 | Source: Ambulatory Visit | Attending: Urology | Admitting: Urology

## 2021-09-22 DIAGNOSIS — R972 Elevated prostate specific antigen [PSA]: Secondary | ICD-10-CM

## 2021-09-22 MED ORDER — GADOBUTROL 1 MMOL/ML IV SOLN
10.0000 mL | Freq: Once | INTRAVENOUS | Status: AC | PRN
  Administered 2021-09-22: 10 mL via INTRAVENOUS

## 2021-09-26 ENCOUNTER — Other Ambulatory Visit: Payer: Self-pay

## 2021-09-26 DIAGNOSIS — R972 Elevated prostate specific antigen [PSA]: Secondary | ICD-10-CM

## 2021-10-16 ENCOUNTER — Telehealth: Payer: Self-pay | Admitting: Urology

## 2021-10-16 NOTE — Telephone Encounter (Signed)
Even though his prostate MRI did not show any concerning lesions, I still recommend keeping a close eye on his PSA and would like it repeated in 6 months.

## 2021-10-19 NOTE — Telephone Encounter (Signed)
Sent pt mychart msg. See separate encounter.  

## 2021-10-20 ENCOUNTER — Other Ambulatory Visit: Payer: Self-pay

## 2021-10-20 DIAGNOSIS — R972 Elevated prostate specific antigen [PSA]: Secondary | ICD-10-CM

## 2021-10-20 DIAGNOSIS — Z87898 Personal history of other specified conditions: Secondary | ICD-10-CM

## 2021-11-21 ENCOUNTER — Telehealth: Payer: Self-pay

## 2021-11-21 NOTE — Telephone Encounter (Signed)
-----   Message from [name], PA-C sent at 11/20/2021 12:36 PM EDT ----- Please have Timothy Price schedule a free and total PSA at this time as we need to follow his labs to ensure his PSA continues to go done.  I realize that his prostate MRI was negative for any concerning lesions for cancer, but prostate MRI can be wrong 25% of the time.

## 2021-12-27 ENCOUNTER — Other Ambulatory Visit: Payer: 59

## 2022-05-14 ENCOUNTER — Telehealth: Payer: Self-pay | Admitting: Urology

## 2022-05-14 NOTE — Telephone Encounter (Signed)
Please let Mr. Schey know that we need for him to repeat his free and total PSA.  He is overdue for this lab follow up.

## 2022-05-15 ENCOUNTER — Other Ambulatory Visit: Payer: Self-pay

## 2022-05-15 DIAGNOSIS — R972 Elevated prostate specific antigen [PSA]: Secondary | ICD-10-CM

## 2022-05-15 DIAGNOSIS — Z87898 Personal history of other specified conditions: Secondary | ICD-10-CM

## 2022-05-15 DIAGNOSIS — N138 Other obstructive and reflux uropathy: Secondary | ICD-10-CM

## 2022-05-15 NOTE — Telephone Encounter (Signed)
LM informing pt.   Lab order placed.

## 2022-05-19 ENCOUNTER — Other Ambulatory Visit
Admission: RE | Admit: 2022-05-19 | Discharge: 2022-05-19 | Disposition: A | Discharge status: Home / Self Care | Payer: 59 | Attending: Urology | Admitting: Urology

## 2022-05-19 DIAGNOSIS — Z87898 Personal history of other specified conditions: Secondary | ICD-10-CM

## 2022-05-19 DIAGNOSIS — R972 Elevated prostate specific antigen [PSA]: Secondary | ICD-10-CM | POA: Diagnosis not present

## 2022-05-20 LAB — PSA: Prostatic Specific Antigen: 1.9 ng/mL (ref 0.00–4.00)

## 2022-05-22 ENCOUNTER — Encounter: Payer: Self-pay | Admitting: *Deleted

## 2022-08-29 ENCOUNTER — Other Ambulatory Visit: Payer: 59

## 2022-08-29 DIAGNOSIS — R972 Elevated prostate specific antigen [PSA]: Secondary | ICD-10-CM

## 2022-08-29 NOTE — Addendum Note (Signed)
Addended by: Consuella Lose on: 08/29/2022 09:10 AM   Modules accepted: Orders

## 2022-08-30 LAB — PSA: Prostate Specific Ag, Serum: 2.4 ng/mL (ref 0.0–4.0)

## 2022-08-30 NOTE — Progress Notes (Unsigned)
08/31/22 4:03 PM   Timothy Price [birth year] [id number]  Referring provider:  [facility] [address] [place],  [place] [address]  Urological history: Elevated PSA  - PSA trend             3.13 in 11/2014             4.27 in 11/2016             Started finasteride in 06/2017             Prostate MRI in 03/2018 was negative             2.4 (4.8) in 08/2022 2. ED  - contributing factors of age, BPH, DM, HTN and HLD - sildenafil 100 mg on-demand-dosing  3. BPH with LUTS  - finasteride 5 mg daily  HPI: Timothy Price is a 62 y.o.male who presents today for 1 year follow-up with IPSS, PVR, and SHIM.   SHIM 12  Patient still having spontaneous erections.  He denies any pain or curvature with erections.   He is starting not to have satisfactory erections with sildenafil 100 mg, on-demand dosing.   SHIM     Row Name 08/31/22 1533         SHIM: Over the last 6 months:   How do you rate your confidence that you could get and keep an erection? Moderate     When you had erections with sexual stimulation, how often were your erections hard enough for penetration (entering your partner)? A Few Times (much less than half the time)     During sexual intercourse, how often were you able to maintain your erection after you had penetrated (entered) your partner? A Few Times (much less than half the time)     During sexual intercourse, how difficult was it to maintain your erection to completion of intercourse? Difficult     When you attempted sexual intercourse, how often was it satisfactory for you? A Few Times (much less than half the time)       SHIM Total Score   SHIM 12               Score: 1-7 Severe ED 8-11 Moderate ED 12-16 Mild-Moderate ED 17-21 Mild ED 22-25 No ED   I PSS 8/1  No urinary complaints.  He continues to take finasteride 5 mg daily.  Patient denies any modifying or aggravating factors.  Patient denies any gross hematuria, dysuria or  suprapubic/flank pain.  Patient denies any fevers, chills, nausea or vomiting.     IPSS     Row Name 08/31/22 1500         International Prostate Symptom Score   How often have you had the sensation of not emptying your bladder? Less than 1 in 5     How often have you had to urinate less than every two hours? Less than 1 in 5 times     How often have you found you stopped and started again several times when you urinated? Less than 1 in 5 times     How often have you found it difficult to postpone urination? Less than 1 in 5 times     How often have you had a weak urinary stream? Less than 1 in 5 times     How often have you had to strain to start urination? Less than 1 in 5 times     How many times did you typically get  up at night to urinate? 2 Times     Total IPSS Score 8       Quality of Life due to urinary symptoms   If you were to spend the rest of your life with your urinary condition just the way it is now how would you feel about that? Pleased               Score:  1-7 Mild 8-19 Moderate 20-35 Severe   PMH: Past Medical History:  Diagnosis Date   Anemia, unspecified 05/29/2017   Overview:  Monitoring   Gastroesophageal reflux disease without esophagitis 11/20/2016   Overview:  Controlled OTC prn treatment only.    HTN (hypertension) 08/08/2013   Hypercholesterolemia 08/08/2013   Prediabetes 04/22/2014   Rupture of quadriceps tendon 06/15/2017    Surgical History: Past Surgical History:  Procedure Laterality Date   KNEE SURGERY  2016   tendon repair    Home Medications:  Allergies as of 08/31/2022   No Known Allergies      Medication List        Accurate as of Aug 31, 2022  4:03 PM. If you have any questions, ask your nurse or doctor.          aspirin EC 81 MG tablet Take by mouth.   atorvastatin 40 MG tablet Commonly known as: LIPITOR Take 40 mg by mouth daily.   finasteride 5 MG tablet Commonly known as: PROSCAR Take 1 tablet by mouth  once daily   lisinopril-hydrochlorothiazide 20-25 MG tablet Commonly known as: ZESTORETIC Take by mouth.   metFORMIN 500 MG 24 hr tablet Commonly known as: GLUCOPHAGE-XR Take 500 mg by mouth daily.   sildenafil 100 MG tablet Commonly known as: VIAGRA Take 1 tablet (100 mg total) by mouth daily as needed for erectile dysfunction.   tadalafil 5 MG tablet Commonly known as: CIALIS Take 1 tablet (5 mg total) by mouth daily as needed for erectile dysfunction. Started by: Michiel Cowboy, PA-C        Allergies:  No Known Allergies  Family History: Family History  Problem Relation Age of Onset   Prostate cancer Neg Hx    Kidney Stones Neg Hx     Social History:  reports that he has never smoked. He has never used smokeless tobacco. He reports current alcohol use. He reports that he does not use drugs.   Physical Exam: BP 132/87   Pulse 87   Ht 6' (1.829 m)   Wt 257 lb 5 oz (116.7 kg)   BMI 34.90 kg/m   Constitutional:  Well nourished. Alert and oriented, No acute distress. HEENT: [place] AT, moist mucus membranes.  Trachea midline Cardiovascular: No clubbing, cyanosis, or edema. Respiratory: Normal respiratory effort, no increased work of breathing. GU: No CVA tenderness.  No bladder fullness or masses.  Patient with circumcised phallus.  Urethral meatus is patent.  No penile discharge. No penile lesions or rashes. Scrotum without lesions, cysts, rashes and/or edema.  Testicles are located scrotally bilaterally. No masses are appreciated in the testicles. Left and right epididymis are normal.  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  Rectal: Patient with  normal sphincter tone. Anus and perineum without scarring or rashes. No rectal masses are appreciated. Prostate is approximately 60 + grams, could only palpate the apex, no nodules are appreciated. Seminal vesicles could not be palpated.  Neurologic: Grossly intact, no focal deficits, moving all 4 extremities. Psychiatric:  Normal mood and affect.   Laboratory Data:  Serum creatinine of 1.3 with a EGFR of 63 on May 01, 2022 Hemoglobin A1c on May 01, 2022 6.4 I have reviewed the labs.   Pertinent Imaging: N/A  Assessment & Plan:    1. BPH with LUTS -repeat free and total PSA in 6 months  -Continue finasteride 5 mg daily  2. ED -Continue sildenafil 100 mg, on-demand dosing -start tadalafil 5 mg daily   Return in about 6 months (around 03/03/2023) for Free and total PSA only .  Cloretta Ned   [facility] [address], [address] [place], [place] [address] [phone]

## 2022-08-31 ENCOUNTER — Encounter: Payer: Self-pay | Admitting: Urology

## 2022-08-31 ENCOUNTER — Other Ambulatory Visit: Payer: Self-pay | Admitting: Urology

## 2022-08-31 ENCOUNTER — Ambulatory Visit: Payer: 59 | Admitting: Urology

## 2022-08-31 VITALS — BP 132/87 | HR 87 | Ht 72.0 in | Wt 257.3 lb

## 2022-08-31 DIAGNOSIS — N401 Enlarged prostate with lower urinary tract symptoms: Secondary | ICD-10-CM

## 2022-08-31 DIAGNOSIS — N529 Male erectile dysfunction, unspecified: Secondary | ICD-10-CM

## 2022-08-31 MED ORDER — TADALAFIL 5 MG PO TABS: 5.0000 mg | ORAL_TABLET | Freq: Every day | ORAL | 3 refills | Status: AC | PRN

## 2022-10-14 IMAGING — CT CT SHOULDER*L* W/O CM
4 of 6 series · 14 of 33 positions shown, 16 images · non-contrast
Comparison: None.

CLINICAL DATA: History of left shoulder dislocation.

EXAM:
CT OF THE UPPER LEFT EXTREMITY WITHOUT CONTRAST
TECHNIQUE: Multidetector CT imaging of the upper left extremity was performed
according to the standard protocol.

[Series 6: ax bone · axial · 0.49mm/px · z∈[+1382,+1526]mm · 5 of 108 slices shown, 7 images]
[im 18/108  soft-tissue]
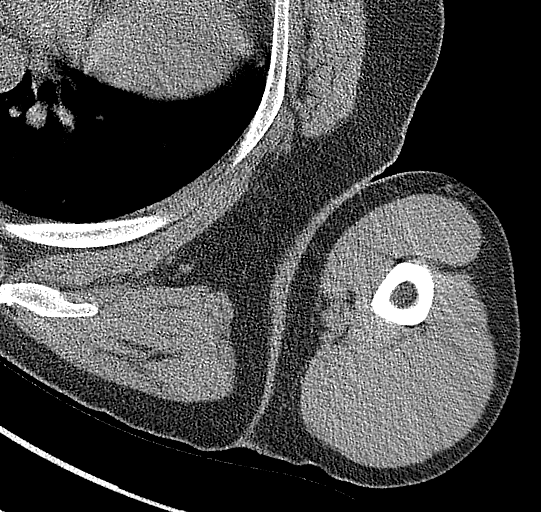
[im 18/108  bone]
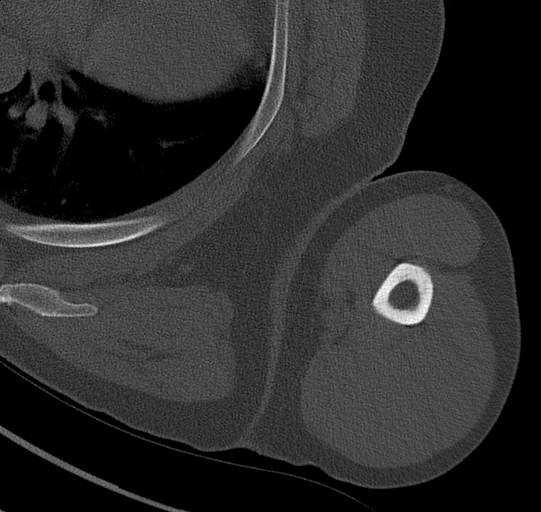
[im 36/108  bone]
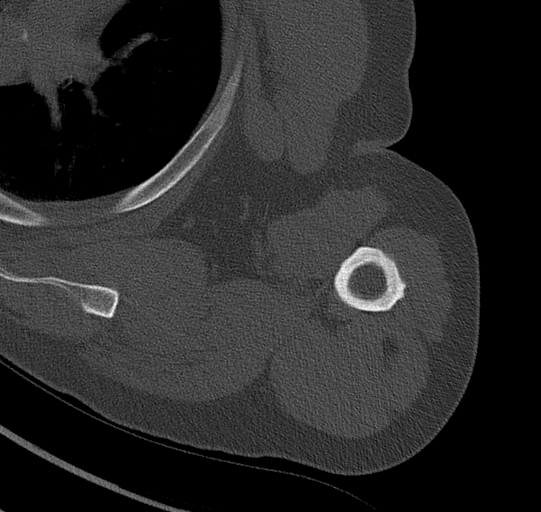
[im 54/108  bone]
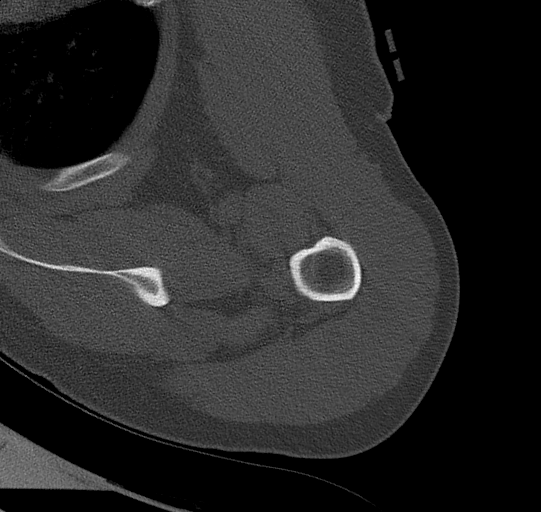
[im 72/108  bone]
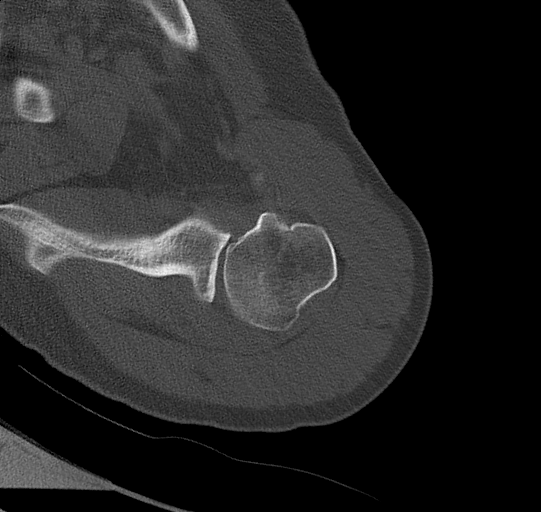
[im 90/108  soft-tissue]
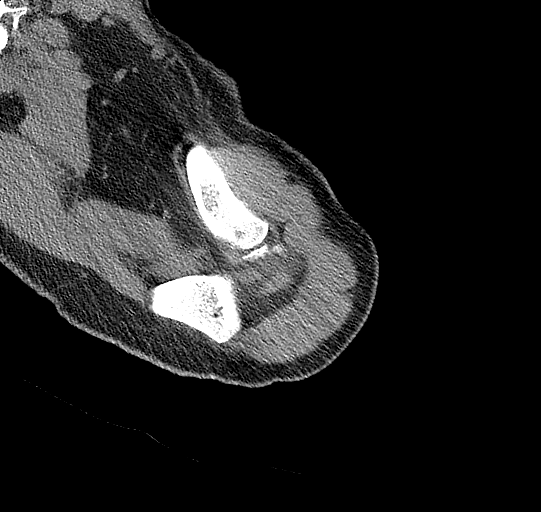
[im 90/108  bone]
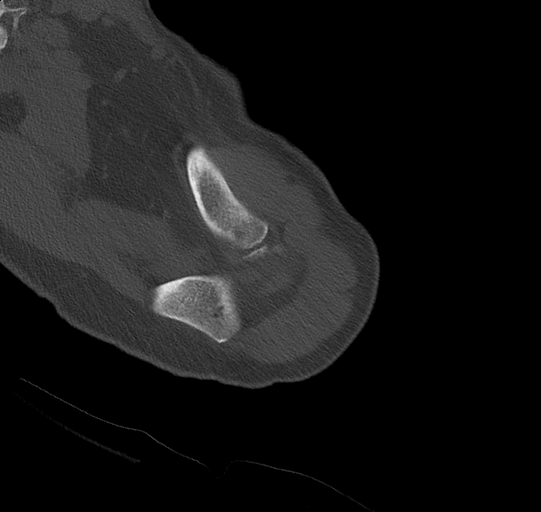

[Series 9: ax st · axial · 0.59mm/px · z∈[+1382,+1418]mm · 2 of 108 slices shown]
[im 18/108  bone]
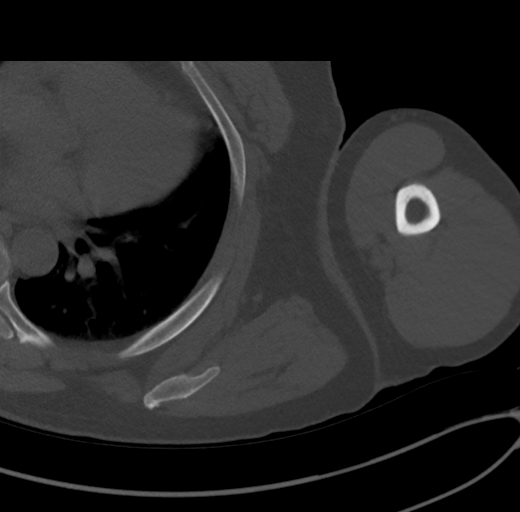
[im 36/108  bone]
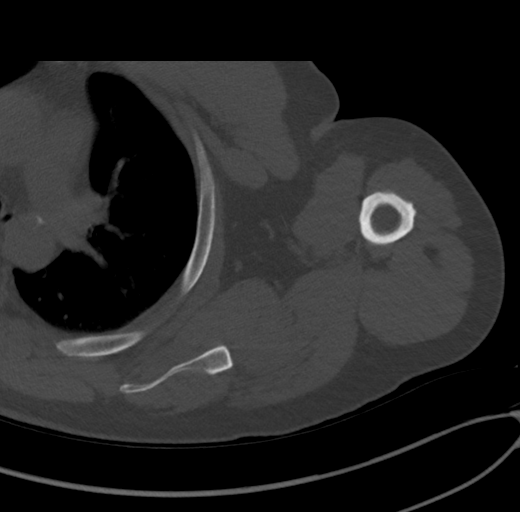

[Series 10: cor st · coronal · 0.50mm/px · 2 of 154 slices shown]
[im 52/154  bone]
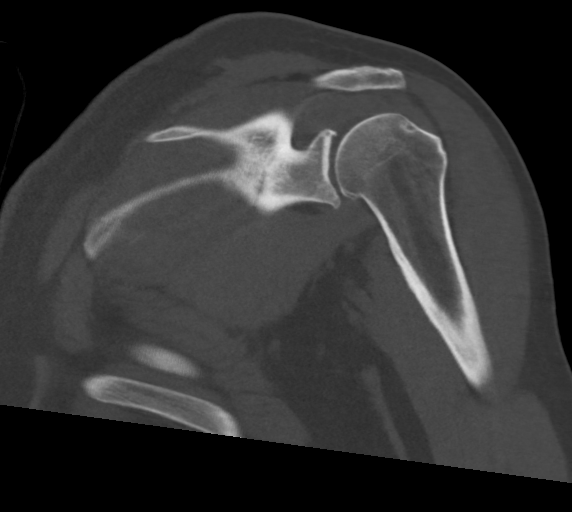
[im 103/154  bone]
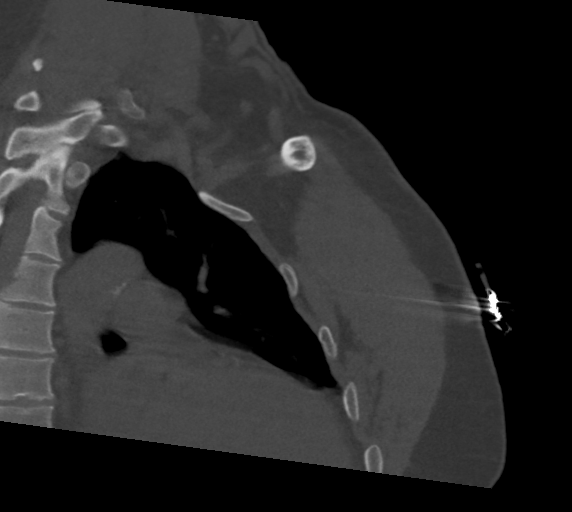

[Series 11: sag st · sagittal · 0.42mm/px · 5 of 160 slices shown]
[im 27/160  bone]
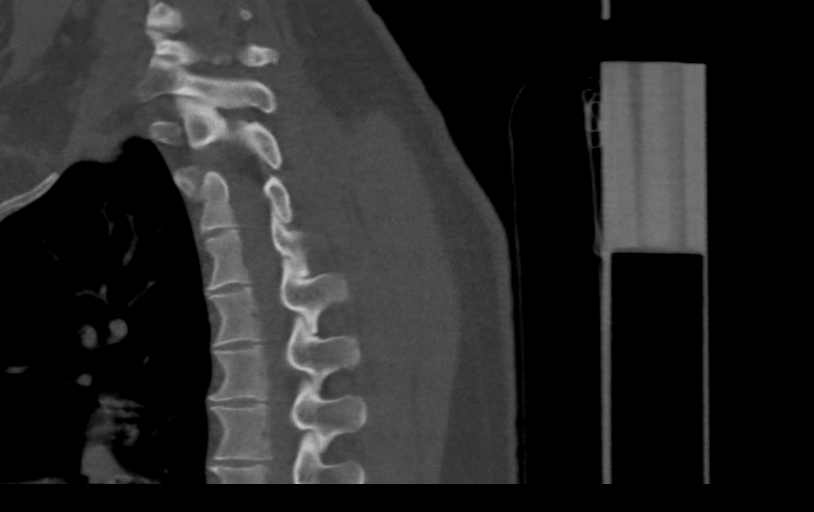
[im 54/160  bone]
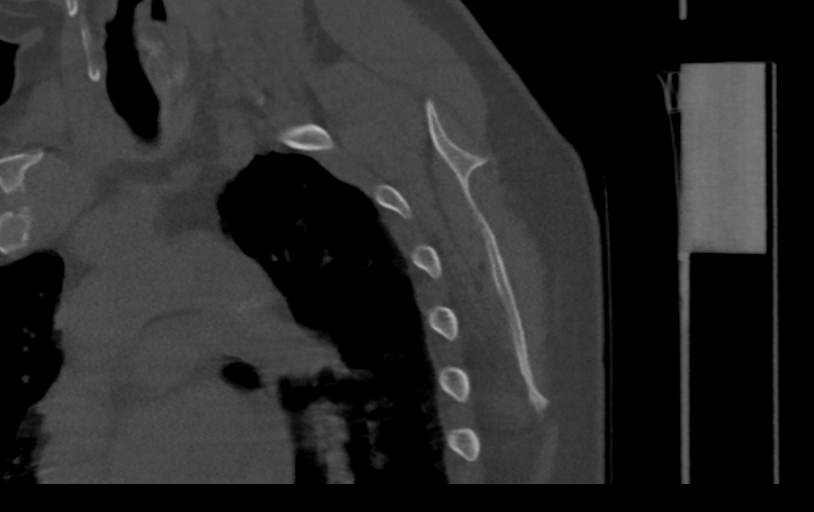
[im 80/160  bone]
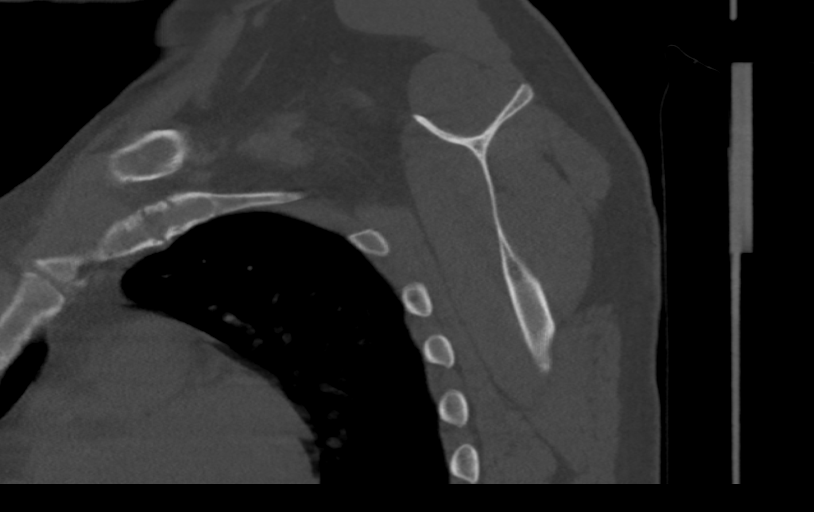
[im 107/160  bone]
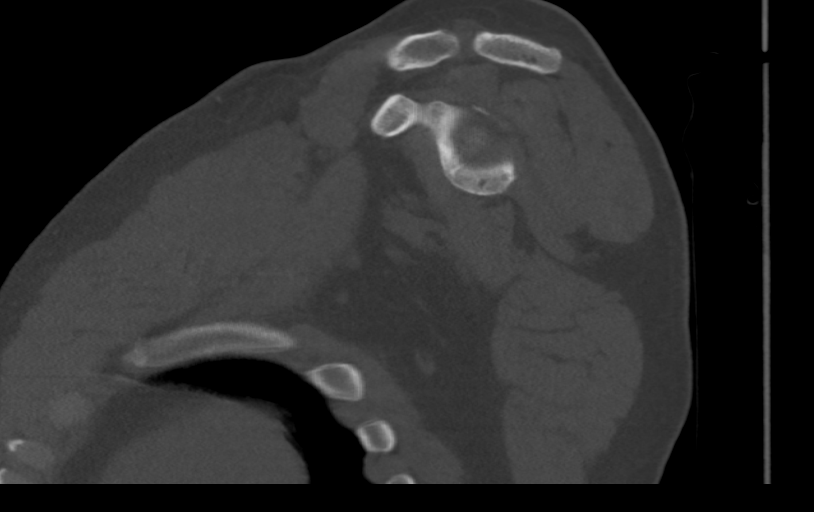
[im 133/160  bone]
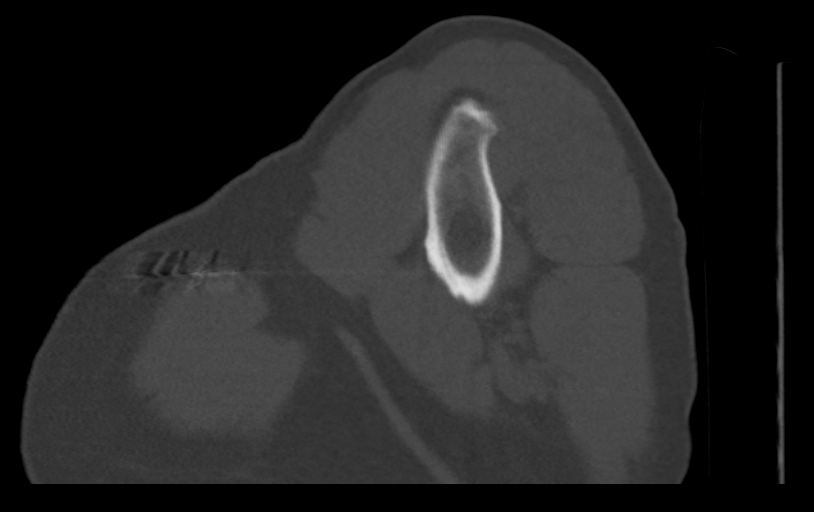

[14 of 33 positions shown; findings below may reference images not displayed]

FINDINGS: Bones/Joint/Cartilage

No fracture or dislocation. Normal alignment. No joint effusion.
Glenohumeral joint space narrowing. Subcortical reactive marrow
changes at the infraspinatus insertion. Mild arthropathy of the
acromioclavicular joint.

Ligaments

Ligaments are suboptimally evaluated by CT.

Muscles and Tendons
Muscles are normal.  No muscle atrophy.

Soft tissue
No fluid collection or hematoma. No soft tissue mass. Visualized
left lung is clear.
IMPRESSION: 1. No acute osseous injury of the left shoulder.
2. Mild osteoarthritis of the left glenohumeral joint.
3. If there is clinical concern regarding rotator cuff pathology or
labral injury, recommend a MR or CT arthrogram.

## 2022-11-29 ENCOUNTER — Other Ambulatory Visit: Payer: Self-pay | Admitting: Urology

## 2022-11-29 DIAGNOSIS — N138 Other obstructive and reflux uropathy: Secondary | ICD-10-CM

## 2023-02-27 ENCOUNTER — Other Ambulatory Visit: Payer: 59

## 2023-02-27 DIAGNOSIS — N138 Other obstructive and reflux uropathy: Secondary | ICD-10-CM

## 2023-02-28 LAB — PSA, TOTAL AND FREE
PSA, Free Pct: 12.6 %
PSA, Free: 0.34 ng/mL
Prostate Specific Ag, Serum: 2.7 ng/mL (ref 0.0–4.0)

## 2023-05-17 ENCOUNTER — Other Ambulatory Visit: Payer: Self-pay | Admitting: *Deleted

## 2023-05-17 DIAGNOSIS — R972 Elevated prostate specific antigen [PSA]: Secondary | ICD-10-CM

## 2023-07-13 ENCOUNTER — Other Ambulatory Visit
Admission: RE | Admit: 2023-07-13 | Discharge: 2023-07-13 | Disposition: A | Discharge status: Home / Self Care | Attending: Urology | Admitting: Urology

## 2023-07-13 DIAGNOSIS — R972 Elevated prostate specific antigen [PSA]: Secondary | ICD-10-CM | POA: Insufficient documentation

## 2023-07-13 LAB — PSA: Prostatic Specific Antigen: 1.8 ng/mL (ref 0.00–4.00)

## 2023-08-13 ENCOUNTER — Encounter: Payer: Self-pay | Admitting: Urology

## 2023-08-13 ENCOUNTER — Ambulatory Visit: Payer: 59 | Admitting: Urology

## 2023-08-13 VITALS — BP 149/88 | HR 79 | Ht 72.0 in | Wt 256.8 lb

## 2023-08-13 DIAGNOSIS — N138 Other obstructive and reflux uropathy: Secondary | ICD-10-CM | POA: Diagnosis not present

## 2023-08-13 DIAGNOSIS — R972 Elevated prostate specific antigen [PSA]: Secondary | ICD-10-CM

## 2023-08-13 DIAGNOSIS — N401 Enlarged prostate with lower urinary tract symptoms: Secondary | ICD-10-CM

## 2023-08-13 DIAGNOSIS — N529 Male erectile dysfunction, unspecified: Secondary | ICD-10-CM | POA: Diagnosis not present

## 2023-08-13 NOTE — Progress Notes (Signed)
 08/13/2023 9:26 AM   Timothy Price September 24, 1960 [id number]  Referring provider: [facility] [address] [place],  [place] [address]  Urological history: 1.  Elevated PSA - PSA (07/2023) 1.80 (3.60) - negative prostate MRI (2019)  - PSA (2018) 4.27 - finasteride  5 mg started in 2019  2. ED - Sildenafil  100 mg, on-demand dosing  3.  BPH with LU TS - Finasteride  5 mg daily and tadalafil  5 mg daily  Chief Complaint  Patient presents with   Benign Prostatic Hypertrophy    HPI: Timothy Price is a 63 y.o. man who presents today for yearly visit.    Previous records reviewed.   I PSS 9/1  He has no urinary complaints.  Patient denies any modifying or aggravating factors.  Patient denies any recent UTI's, gross hematuria, dysuria or suprapubic/flank pain.  Patient denies any fevers, chills, nausea or vomiting.     IPSS     Row Name 08/13/23 0800         International Prostate Symptom Score   How often have you had the sensation of not emptying your bladder? Less than 1 in 5     How often have you had to urinate less than every two hours? Less than half the time     How often have you found you stopped and started again several times when you urinated? Less than 1 in 5 times     How often have you found it difficult to postpone urination? Less than 1 in 5 times     How often have you had a weak urinary stream? Less than 1 in 5 times     How often have you had to strain to start urination? Less than 1 in 5 times     How many times did you typically get up at night to urinate? 2 Times     Total IPSS Score 9       Quality of Life due to urinary symptoms   If you were to spend the rest of your life with your urinary condition just the way it is now how would you feel about that? Pleased              Score:  1-7 Mild 8-19 Moderate 20-35 Severe   SHIM 17   Patient still having spontaneous erections.  He denies any pain or curvature with erections.   He is not as sexually active as he has been in the past, so he does not use the PDE 5 inhibitors very often.   SHIM     Row Name 08/13/23 0844         SHIM: Over the last 6 months:   How do you rate your confidence that you could get and keep an erection? Low     When you had erections with sexual stimulation, how often were your erections hard enough for penetration (entering your partner)? Most Times (much more than half the time)     During sexual intercourse, how often were you able to maintain your erection after you had penetrated (entered) your partner? Sometimes (about half the time)     During sexual intercourse, how difficult was it to maintain your erection to completion of intercourse? Slightly Difficult     When you attempted sexual intercourse, how often was it satisfactory for you? Most Times (much more than half the time)       SHIM Total Score   SHIM 17  Score: 1-7 Severe ED 8-11 Moderate ED 12-16 Mild-Moderate ED 17-21 Mild ED 22-25 No ED   PMH: Past Medical History:  Diagnosis Date   Anemia, unspecified 05/29/2017   Overview:  Monitoring   Gastroesophageal reflux disease without esophagitis 11/20/2016   Overview:  Controlled OTC prn treatment only.    HTN (hypertension) 08/08/2013   Hypercholesterolemia 08/08/2013   Prediabetes 04/22/2014   Rupture of quadriceps tendon 06/15/2017    Surgical History: Past Surgical History:  Procedure Laterality Date   KNEE SURGERY  2016   tendon repair    Home Medications:  Allergies as of 08/13/2023   No Known Allergies      Medication List        Accurate as of Aug 13, 2023  9:26 AM. If you have any questions, ask your nurse or doctor.          aspirin EC 81 MG tablet Take by mouth.   atorvastatin 40 MG tablet Commonly known as: LIPITOR Take 40 mg by mouth daily.   finasteride  5 MG tablet Commonly known as: PROSCAR  Take 1 tablet by mouth once daily   lisinopril-hydrochlorothiazide  20-25 MG tablet Commonly known as: ZESTORETIC Take by mouth.   metFORMIN 500 MG 24 hr tablet Commonly known as: GLUCOPHAGE-XR Take 500 mg by mouth daily.   sildenafil  100 MG tablet Commonly known as: VIAGRA  Take 1 tablet (100 mg total) by mouth daily as needed for erectile dysfunction.   tadalafil  5 MG tablet Commonly known as: CIALIS  Take 1 tablet (5 mg total) by mouth daily as needed for erectile dysfunction.        Allergies: No Known Allergies  Family History: Family History  Problem Relation Age of Onset   Prostate cancer Neg Hx    Kidney Stones Neg Hx     Social History:  reports that he has never smoked. He has never used smokeless tobacco. He reports current alcohol use. He reports that he does not use drugs.  ROS: Pertinent ROS in HPI  Physical Exam: BP (!) 149/88 (BP Location: Left Arm, Patient Position: Sitting, Cuff Size: Normal)   Pulse 79   Ht 6' (1.829 m)   Wt 256 lb 12.8 oz (116.5 kg)   SpO2 97%   BMI 34.83 kg/m   Constitutional:  Well nourished. Alert and oriented, No acute distress. HEENT: [place] AT, moist mucus membranes.  Trachea midline Cardiovascular: No clubbing, cyanosis, or edema. Respiratory: Normal respiratory effort, no increased work of breathing. GU: No CVA tenderness.  No bladder fullness or masses.  Patient with circumcised phallus.  Urethral meatus is patent.  No penile discharge. No penile lesions or rashes. Scrotum without lesions, cysts, rashes and/or edema.  Testicles are located scrotally bilaterally. No masses are appreciated in the testicles. Left and right epididymis are normal. Rectal: Patient with  normal sphincter tone. Anus and perineum without scarring or rashes. No rectal masses are appreciated. Prostate is approximately 50 +  grams, could not palpate the entire gland, no nodules are appreciated.  Seminal vesicles could not be palpated.  Neurologic: Grossly intact, no focal deficits, moving all 4 extremities. Psychiatric:  Normal mood and affect.  Laboratory Data: CBC w/auto Differential (3 Part) Order: [id number] Component Ref Range & Units 7 mo ago  WBC (White Blood Cell Count) 4.1 - 10.2 10^3/uL 5.7  RBC (Red Blood Cell Count) 4.69 - 6.13 10^6/uL 5.83  Hemoglobin 14.1 - 18.1 gm/dL 40.9 Low   Hematocrit 81.1 - 52.0 % 46  MCV (Mean  Corpuscular Volume) 80.0 - 100.0 fl 78.9 Low   MCH (Mean Corpuscular Hemoglobin) 27.0 - 31.2 pg 23.7 Low   MCHC (Mean Corpuscular Hemoglobin Concentration) 32.0 - 36.0 gm/dL 30 Low   Platelet Count 150 - 450 10^3/uL 257  RDW-CV (Red Cell Distribution Width) 11.6 - 14.8 % 14.5  MPV (Mean Platelet Volume) 9.4 - 12.4 fl 10.3  Neutrophils 1.50 - 7.80 10^3/uL 3.5  Lymphocytes 1.00 - 3.60 10^3/uL 1.8  Mixed Count 0.10 - 0.90 10^3/uL 0.4  Neutrophil % 32.0 - 70.0 % 61.9  Lymphocyte % 10.0 - 50.0 % 31.3  Mixed % 3.0 - 14.4 % 6.8  Resulting Agency [facility]   Specimen Collected: 01/12/23 14:25   Performed by: [facility] Last Resulted: 01/12/23 15:58  Received From: [facility]  Result Received: 02/27/23 14:12   Comprehensive Metabolic Panel (CMP) Order: [id number] Component Ref Range & Units 7 mo ago  Glucose 70 - 110 mg/dL 93  Sodium 469 - 629 mmol/L 137  Potassium 3.6 - 5.1 mmol/L 4.4  Chloride 97 - 109 mmol/L 97  Carbon Dioxide (CO2) 22.0 - 32.0 mmol/L 29.3  Urea Nitrogen (BUN) 7 - 25 mg/dL 19  Creatinine 0.7 - 1.3 mg/dL 1.1  Glomerular Filtration Rate (eGFR) >60 mL/min/1.73sq m 77  Comment: CKD-EPI (2021) does not include patient's race in the calculation of eGFR.  Monitoring changes of plasma creatinine and eGFR over time is useful for monitoring kidney function.  Interpretive Ranges for eGFR (CKD-EPI 2021):  eGFR:       >60 mL/min/1.73 sq. m - Normal eGFR:       30-59 mL/min/1.73 sq. m - Moderately Decreased eGFR:       15-29 mL/min/1.73 sq. m  - Severely Decreased eGFR:       < 15  mL/min/1.73 sq. m  - Kidney Failure   Note: These eGFR calculations do not apply in acute situations when eGFR is changing rapidly or patients on dialysis.  Calcium 8.7 - 10.3 mg/dL 9.9  AST 8 - 39 U/L 23  ALT 6 - 57 U/L 31  Alk Phos (alkaline Phosphatase) 34 - 104 U/L 81  Albumin 3.5 - 4.8 g/dL 4.5  Bilirubin, Total 0.3 - 1.2 mg/dL 0.5  Protein, Total 6.1 - 7.9 g/dL 7.3  A/G Ratio 1.0 - 5.0 gm/dL 1.6  Resulting Agency [facility]   Specimen Collected: 01/12/23 14:25   Performed by: [facility] Last Resulted: 01/15/23 15:05  Received From: [facility]  Result Received: 02/27/23 14:12   Hemoglobin A1C Order: [id number] Component Ref Range & Units 7 mo ago  Hemoglobin A1C 4.2 - 5.6 % 6.3 High   Average Blood Glucose (Calc) mg/dL 010  Resulting Agency [facility]  Narrative Performed by [facility] Normal Range:    4.2 - 5.6% Increased Risk:  5.7 - 6.4% Diabetes:        >= 6.5% Glycemic Control for adults with diabetes:  <7%    Specimen Collected: 01/12/23 14:25   Performed by: [facility] Last Resulted: 01/15/23 13:43  Received From: [facility]  Result Received: 02/27/23 14:12   Lipid Panel w/calc LDL Order: [id number] Component Ref Range & Units 7 mo ago  Cholesterol, Total 100 - 200 mg/dL 034 High   Triglyceride 35 - 199 mg/dL 88  HDL (High Density Lipoprotein) Cholesterol 29.0 - 71.0 mg/dL  76.1 High   LDL Calculated 0 - 130 mg/dL 409  VLDL Cholesterol mg/dL 18  Cholesterol/HDL Ratio 2.8  Resulting Agency [facility]   Specimen Collected: 01/12/23 14:25   Performed by: [facility] Last Resulted: 01/15/23 15:07  Received From: [facility]  Result Received: 02/27/23 14:12   Thyroid Stimulating-Hormone (TSH) Order: [id number] Component Ref Range & Units 7 mo ago  Thyroid Stimulating Hormone  (TSH) 0.450-5.330 uIU/ml uIU/mL 1.043  Resulting Agency [facility]   Specimen Collected: 01/12/23 14:25   Performed by: [facility] Last Resulted: 01/15/23 15:10  Received From: [facility]  Result Received: 02/27/23 14:12  I have reviewed the labs.   Pertinent Imaging: N/A  Assessment & Plan:    1. Elevated PSA - PSA remains within normal limits and stable - Continue finasteride  5 mg daily  2. BPH with LU TS - PSA stable - DRE benign - continue finasteride  5 mg daily  3. ED - continue PDE5i's as needed   Return in about 1 year (around 08/12/2024) for PSA, I PSS, SHIM and exam .  These notes generated with voice recognition software. I apologize for typographical errors.  Briant Camper  [facility] [address]  [address] [place], [place] [address] [phone]

## 2023-11-24 ENCOUNTER — Other Ambulatory Visit: Payer: Self-pay | Admitting: Urology

## 2023-11-24 DIAGNOSIS — N138 Other obstructive and reflux uropathy: Secondary | ICD-10-CM

## 2024-02-23 ENCOUNTER — Other Ambulatory Visit: Payer: Self-pay | Admitting: Urology

## 2024-02-23 DIAGNOSIS — N401 Enlarged prostate with lower urinary tract symptoms: Secondary | ICD-10-CM

## 2024-03-24 ENCOUNTER — Ambulatory Visit
Admission: RE | Admit: 2024-03-24 | Discharge: 2024-03-24 | Disposition: A | Discharge status: Home / Self Care | Attending: Gastroenterology | Admitting: Gastroenterology

## 2024-03-24 ENCOUNTER — Ambulatory Visit: Admitting: Anesthesiology

## 2024-03-24 ENCOUNTER — Encounter: Payer: Self-pay | Admitting: Gastroenterology

## 2024-03-24 ENCOUNTER — Other Ambulatory Visit: Payer: Self-pay

## 2024-03-24 ENCOUNTER — Encounter: Admission: RE | Disposition: A | Payer: Self-pay | Attending: Gastroenterology

## 2024-03-24 DIAGNOSIS — D122 Benign neoplasm of ascending colon: Secondary | ICD-10-CM | POA: Diagnosis not present

## 2024-03-24 DIAGNOSIS — K219 Gastro-esophageal reflux disease without esophagitis: Secondary | ICD-10-CM | POA: Diagnosis not present

## 2024-03-24 DIAGNOSIS — I10 Essential (primary) hypertension: Secondary | ICD-10-CM | POA: Diagnosis not present

## 2024-03-24 DIAGNOSIS — Z79899 Other long term (current) drug therapy: Secondary | ICD-10-CM | POA: Diagnosis not present

## 2024-03-24 DIAGNOSIS — D124 Benign neoplasm of descending colon: Secondary | ICD-10-CM | POA: Insufficient documentation

## 2024-03-24 DIAGNOSIS — Z87891 Personal history of nicotine dependence: Secondary | ICD-10-CM | POA: Insufficient documentation

## 2024-03-24 DIAGNOSIS — Z1211 Encounter for screening for malignant neoplasm of colon: Secondary | ICD-10-CM | POA: Insufficient documentation

## 2024-03-24 HISTORY — PX: POLYPECTOMY: SHX149

## 2024-03-24 HISTORY — PX: COLONOSCOPY: SHX5424

## 2024-03-24 SURGERY — COLONOSCOPY
Anesthesia: General

## 2024-03-24 MED ORDER — PROPOFOL 10 MG/ML IV BOLUS
INTRAVENOUS | Status: AC
  Filled 2024-03-24: qty 40

## 2024-03-24 MED ORDER — SODIUM CHLORIDE 0.9 % IV SOLN: INTRAVENOUS | Status: DC

## 2024-03-24 MED ORDER — PROPOFOL 500 MG/50ML IV EMUL
INTRAVENOUS | Status: DC | PRN
  Administered 2024-03-24: 150 ug/kg/min via INTRAVENOUS

## 2024-03-24 MED ORDER — PROPOFOL 10 MG/ML IV BOLUS
INTRAVENOUS | Status: DC | PRN
  Administered 2024-03-24: 100 mg via INTRAVENOUS

## 2024-03-24 NOTE — H&P (Signed)
 "  Timothy Price , MD [address], [place], [place], [address] Phone: [phone] Fax: [phone]  Primary Care Physician:  [facility]   Pre-Procedure History & Physical: HPI:  Timothy Price is a 63 y.o. male is here for an colonoscopy.   Past Medical History:  Diagnosis Date   Anemia, unspecified 05/29/2017   Overview:  Monitoring   Gastroesophageal reflux disease without esophagitis 11/20/2016   Overview:  Controlled OTC prn treatment only.    HTN (hypertension) 08/08/2013   Hypercholesterolemia 08/08/2013   Prediabetes 04/22/2014   Rupture of quadriceps tendon 06/15/2017    Past Surgical History:  Procedure Laterality Date   KNEE SURGERY  2016   tendon repair    Prior to Admission medications  Medication Sig Start Date End Date Taking? Authorizing Provider  atorvastatin (LIPITOR) 40 MG tablet Take 40 mg by mouth daily. 03/22/20  Yes [provider]  finasteride  (PROSCAR ) 5 MG tablet Take 1 tablet by mouth once daily 02/25/24  Yes McGowan, Shannon A, PA-C  lisinopril-hydrochlorothiazide (PRINZIDE,ZESTORETIC) 20-25 MG tablet Take by mouth. 05/04/17  Yes [provider]  aspirin EC 81 MG tablet Take by mouth.    [provider]  metFORMIN (GLUCOPHAGE-XR) 500 MG 24 hr tablet Take 500 mg by mouth daily. 02/16/20   [provider]  sildenafil  (VIAGRA ) 100 MG tablet Take 1 tablet (100 mg total) by mouth daily as needed for erectile dysfunction. 08/30/21   Helon Kirsch A, PA-C  tadalafil  (CIALIS ) 5 MG tablet Take 1 tablet (5 mg total) by mouth daily as needed for erectile dysfunction. 08/31/22   Helon Kirsch A, PA-C    Allergies as of 03/05/2024   (No Known Allergies)    Family History  Problem Relation Age of Onset   Prostate cancer Neg Hx    Kidney Stones Neg Hx     Social History   Socioeconomic History   Marital status: Married    Spouse name: Not on file   Number of children: Not on file   Years of  education: Not on file   Highest education level: Not on file  Occupational History   Not on file  Tobacco Use   Smoking status: Never   Smokeless tobacco: Never  Substance and Sexual Activity   Alcohol use: Yes    Comment: occ   Drug use: No   Sexual activity: Not on file  Other Topics Concern   Not on file  Social History Narrative   Not on file   Social Drivers of Health   Tobacco Use: Low Risk (03/24/2024)   Patient History    Smoking Tobacco Use: Never    Smokeless Tobacco Use: Never    Passive Exposure: Not on file  Recent Concern: Tobacco Use - Medium Risk (03/05/2024)   Received from [facility]   Patient History    Smoking Tobacco Use: Never    Smokeless Tobacco Use: Former    Passive Exposure: Never  Physicist, Medical Strain: Low Risk  (01/14/2024)   Received from [facility]   Overall Financial Resource Strain (CARDIA)    Difficulty of Paying Living Expenses: Not hard at all  Food Insecurity: No Food Insecurity (01/14/2024)   Received from [facility]   Epic    Within the past 12 months, you worried that your food would run out before you got the money to buy more.: Never true    Within the past 12  months, the food you bought just didn't last and you didn't have money to get more.: Never true  Transportation Needs: No Transportation Needs (01/14/2024)   Received from [facility] - Transportation    In the past 12 months, has lack of transportation kept you from medical appointments or from getting medications?: No    Lack of Transportation (Non-Medical): No  Physical Activity: Sufficiently Active (01/14/2024)   Received from [facility]   Exercise Vital Sign    On average, how many days per week do you engage in moderate to strenuous exercise (like a brisk walk)?: 4 days    On average, how many minutes do you engage in exercise at this level?: 40 min  Stress:  No Stress Concern Present (01/14/2024)   Received from [facility] - Occupational Stress Questionnaire    Feeling of Stress : Not at all  Social Connections: Socially Integrated (01/14/2024)   Received from [facility]   Social Connection and Isolation Panel    In a typical week, how many times do you talk on the phone with family, friends, or neighbors?: More than three times a week    How often do you get together with friends or relatives?: Once a week    How often do you attend church or religious services?: 1 to 4 times per year    Do you belong to any clubs or organizations such as church groups, unions, fraternal or athletic groups, or school groups?: Yes    How often do you attend meetings of the clubs or organizations you belong to?: More than 4 times per year    Are you married, widowed, divorced, separated, never married, or living with a partner?: Married  Intimate Partner Violence: Not on file  Depression (PHQ2-9): Not on file  Alcohol Screen: Not on file  Housing: Low Risk  (01/14/2024)   Received from [facility]   Epic    In the last 12 months, was there a time when you were not able to pay the mortgage or rent on time?: No    In the past 12 months, how many times have you moved where you were living?: 0    At any time in the past 12 months, were you homeless or living in a shelter (including now)?: No  Utilities: Not At Risk (01/14/2024)   Received from [facility]   Epic    In the past 12 months has the electric, gas, oil, or water company threatened to shut off services in your home?: No  Health Literacy: Adequate Health Literacy (01/14/2024)   Received from [facility]   [phone] Health Literacy    How often do you need to have someone help you when you read instructions, pamphlets, or other written material from your doctor or pharmacy?:  Never    Review of Systems: See HPI, otherwise negative ROS  Physical Exam: There were no vitals taken for this visit. General:   Alert,  pleasant and cooperative in NAD Head:  Normocephalic and atraumatic. Neck:  Supple; no masses or thyromegaly. Lungs:  Clear throughout to auscultation, normal respiratory effort.    Heart:  +S1, +S2, Regular rate and rhythm, No edema. Abdomen:  Soft, nontender and nondistended. Normal bowel sounds, without guarding, and without rebound.   Neurologic:  Alert and  oriented x4;  grossly normal neurologically.  Impression/Plan: Timothy Price is here for an colonoscopy to be performed for surveillance due to prior history of colon polyps   Risks, benefits, limitations, and alternatives regarding  colonoscopy have been reviewed with the patient.  Questions have been answered.  All parties agreeable.   Timothy Kung, MD  03/24/2024, 10:01 AM  "

## 2024-03-24 NOTE — Op Note (Signed)
 [facility] Patient Name: Timothy Price Procedure Date: 03/24/2024 10:36 AM MRN: [id number] Account #: [id number] Date of Birth: 17-Sep-1960 Admit Type: Outpatient Age: 63 Room: [facility] ENDO ROOM 2 Gender: Male Note Status: Finalized Instrument Name: Colon Scope [id number] Procedure:             Colonoscopy Indications:           Surveillance: Personal history of colonic polyps                         (unknown histology) on last colonoscopy more than 5                         years ago Providers:             Ruel Kung MD, MD Referring MD:          [facility] Medicines:             Monitored Anesthesia Care Complications:         No immediate complications. Procedure:             Pre-Anesthesia Assessment:                        - Prior to the procedure, a History and Physical was                         performed, and patient medications, allergies and                         sensitivities were reviewed. The patient's tolerance                         of previous anesthesia was reviewed.                        - The risks and benefits of the procedure and the                         sedation options and risks were discussed with the                         patient. All questions were answered and informed                         consent was obtained.                        - ASA Grade Assessment: II - A patient with mild                         systemic disease.                        After obtaining informed consent, the colonoscope was                         passed under direct vision. Throughout the procedure,                         the patient's blood pressure, pulse, and  oxygen                         saturations were monitored continuously. The                         Colonoscope was introduced through the anus and                         advanced to the the cecum, identified by the                         appendiceal orifice. The  colonoscopy was performed                         with ease. The patient tolerated the procedure well.                         The quality of the bowel preparation was excellent.                         The ileocecal valve, appendiceal orifice, and rectum                         were photographed. Findings:      The perianal and digital rectal examinations were normal.      Three sessile polyps were found in the ascending colon. The polyps were       4 to 5 mm in size. These polyps were removed with a cold snare.       Resection and retrieval were complete.      A 7 mm polyp was found in the descending colon. The polyp was sessile.       The polyp was removed with a cold snare. Resection and retrieval were       complete.      A large polypoid lesion was found in the transverse colon. The lesion       was sessile. No bleeding was present. Mucosa was biopsied with a cold       forceps for histology. One specimen bottle was sent to pathology. likely       a fold vs diverticulum normal mucosa pit pattern      The exam was otherwise without abnormality on direct and retroflexion       views. Impression:            - Three 4 to 5 mm polyps in the ascending colon,                         removed with a cold snare. Resected and retrieved.                        - One 7 mm polyp in the descending colon, removed with                         a cold snare. Resected and retrieved.                        - Likely benign polypoid lesion in the transverse  colon. Biopsied.                        - The examination was otherwise normal on direct and                         retroflexion views. Recommendation:        - Discharge patient to home (with escort).                        - Resume previous diet.                        - Continue present medications.                        - Await pathology results.                        - Repeat colonoscopy for surveillance based on                          pathology results. Procedure Code(s):     --- Professional ---                        [phone], Colonoscopy, flexible; with removal of                         tumor(s), polyp(s), or other lesion(s) by snare                         technique Diagnosis Code(s):     --- Professional ---                        Z86.010, Personal history of colonic polyps                        D12.2, Benign neoplasm of ascending colon                        D12.4, Benign neoplasm of descending colon                        D49.0, Neoplasm of unspecified behavior of digestive                         system CPT copyright 2022 American Medical Association. All rights reserved. The codes documented in this report are preliminary and upon coder review may  be revised to meet current compliance requirements. Ruel Kung, MD Ruel Kung MD, MD 03/24/2024 11:11:12 AM This report has been signed electronically. Number of Addenda: 0 Note Initiated On: 03/24/2024 10:36 AM Scope Withdrawal Time: 0 hours 18 minutes 2 seconds  Total Procedure Duration: 0 hours 20 minutes 43 seconds  Estimated Blood Loss:  Estimated blood loss: none.      [facility]

## 2024-03-24 NOTE — Anesthesia Postprocedure Evaluation (Signed)
"   Anesthesia Post Note  Patient: Timothy Price  Procedure(s) Performed: COLONOSCOPY POLYPECTOMY, INTESTINE  Patient location during evaluation: PACU Anesthesia Type: General Level of consciousness: awake Pain management: satisfactory to patient Vital Signs Assessment: post-procedure vital signs reviewed and stable Respiratory status: spontaneous breathing Cardiovascular status: blood pressure returned to baseline Anesthetic complications: no   No notable events documented.   Last Vitals:  Vitals:   03/24/24 1111 03/24/24 1121  BP: 99/66 115/75  Pulse: 66   Resp: 18 (!) 23  Temp: (!) 35.9 C   SpO2: 99% 99%    Last Pain:  Vitals:   03/24/24 1121  TempSrc:   PainSc: 0-No pain                 VAN STAVEREN,Annalissa Murphey      "

## 2024-03-24 NOTE — Transfer of Care (Signed)
 Immediate Anesthesia Transfer of Care Note  Patient: Timothy Price  Procedure(s) Performed: COLONOSCOPY POLYPECTOMY, INTESTINE  Patient Location: PACU and Endoscopy Unit  Anesthesia Type:General  Level of Consciousness: awake, alert , oriented, and patient cooperative  Airway & Oxygen Therapy: Patient Spontanous Breathing  Post-op Assessment: Report given to RN, Post -op Vital signs reviewed and stable, and Patient moving all extremities X 4  Post vital signs: Reviewed and stable  Last Vitals:  Vitals Value Taken Time  BP 99/66 03/24/24 11:11  Temp 35.9 C 03/24/24 11:11  Pulse 66 03/24/24 11:11  Resp 18 03/24/24 11:11  SpO2 99 % 03/24/24 11:11    Last Pain:  Vitals:   03/24/24 1111  TempSrc: Temporal  PainSc: 0-No pain         Complications: No notable events documented.

## 2024-03-24 NOTE — Anesthesia Preprocedure Evaluation (Signed)
"                                    Anesthesia Evaluation  Patient identified by MRN, date of birth, ID band Patient awake    Reviewed: Allergy & Precautions, NPO status , Patient's Chart, lab work & pertinent test results  Airway Mallampati: II  TM Distance: >3 FB Neck ROM: Full    Dental  (+) Teeth Intact   Pulmonary neg pulmonary ROS   Pulmonary exam normal        Cardiovascular Exercise Tolerance: Good hypertension, Pt. on medications negative cardio ROS Normal cardiovascular exam Rhythm:Regular     Neuro/Psych negative neurological ROS  negative psych ROS   GI/Hepatic negative GI ROS, Neg liver ROS,GERD  ,,  Endo/Other  negative endocrine ROS  Class 3 obesity  Renal/GU negative Renal ROS  negative genitourinary   Musculoskeletal   Abdominal   Peds negative pediatric ROS (+)  Hematology negative hematology ROS (+)   Anesthesia Other Findings Past Medical History: 05/29/2017: Anemia, unspecified     Comment:  Overview:  Monitoring 11/20/2016: Gastroesophageal reflux disease without esophagitis     Comment:  Overview:  Controlled OTC prn treatment only.  08/08/2013: HTN (hypertension) 08/08/2013: Hypercholesterolemia 04/22/2014: Prediabetes 06/15/2017: Rupture of quadriceps tendon  Past Surgical History: 2016: KNEE SURGERY     Comment:  tendon repair  BMI    Body Mass Index: 34.56 kg/m      Reproductive/Obstetrics negative OB ROS                              Anesthesia Physical Anesthesia Plan  ASA: 2  Anesthesia Plan: General   Post-op Pain Management:    Induction: Intravenous  PONV Risk Score and Plan: Propofol  infusion and TIVA  Airway Management Planned: Natural Airway and Nasal Cannula  Additional Equipment:   Intra-op Plan:   Post-operative Plan:   Informed Consent: I have reviewed the patients History and Physical, chart, labs and discussed the procedure including the risks, benefits and  alternatives for the proposed anesthesia with the patient or authorized representative who has indicated his/her understanding and acceptance.     Dental Advisory Given  Plan Discussed with: CRNA  Anesthesia Plan Comments:         Anesthesia Quick Evaluation  "

## 2024-03-25 LAB — SURGICAL PATHOLOGY

## 2024-03-30 ENCOUNTER — Ambulatory Visit: Payer: Self-pay | Admitting: Gastroenterology

## 2024-08-18 ENCOUNTER — Ambulatory Visit: Admitting: Urology
